# Patient Record
Sex: Female | Born: 1980 | Race: White | Hispanic: No | Marital: Single | Smoking: Former smoker
Health system: Southern US, Community
[De-identification: ages and names within clinical notes are randomized; demographics above are authoritative.]

## PROBLEM LIST (undated history)

## (undated) DIAGNOSIS — N611 Abscess of the breast and nipple: Secondary | ICD-10-CM

## (undated) DIAGNOSIS — E079 Disorder of thyroid, unspecified: Secondary | ICD-10-CM

## (undated) HISTORY — PX: TUBAL LIGATION: SHX77

## (undated) HISTORY — DX: Abscess of the breast and nipple: N61.1

## (undated) HISTORY — DX: Disorder of thyroid, unspecified: E07.9

---

## 2002-03-26 ENCOUNTER — Observation Stay (HOSPITAL_COMMUNITY): Admission: EM | Admit: 2002-03-26 | Discharge: 2002-03-27 | Payer: Self-pay | Admitting: *Deleted

## 2003-01-16 ENCOUNTER — Encounter (INDEPENDENT_AMBULATORY_CARE_PROVIDER_SITE_OTHER): Payer: Self-pay | Admitting: Specialist

## 2003-01-16 ENCOUNTER — Inpatient Hospital Stay (HOSPITAL_COMMUNITY): Admission: EM | Admit: 2003-01-16 | Discharge: 2003-01-17 | Payer: Self-pay | Admitting: Emergency Medicine

## 2003-01-16 ENCOUNTER — Encounter: Payer: Self-pay | Admitting: Emergency Medicine

## 2003-04-18 HISTORY — PX: APPENDECTOMY: SHX54

## 2004-05-19 ENCOUNTER — Other Ambulatory Visit: Admission: RE | Admit: 2004-05-19 | Discharge: 2004-05-19 | Payer: Self-pay | Admitting: Obstetrics and Gynecology

## 2004-12-12 ENCOUNTER — Inpatient Hospital Stay (HOSPITAL_COMMUNITY): Admission: RE | Admit: 2004-12-12 | Discharge: 2004-12-15 | Payer: Self-pay | Admitting: Obstetrics and Gynecology

## 2005-02-04 ENCOUNTER — Other Ambulatory Visit: Admission: RE | Admit: 2005-02-04 | Discharge: 2005-02-04 | Payer: Self-pay | Admitting: Obstetrics and Gynecology

## 2008-04-19 ENCOUNTER — Encounter: Admission: RE | Admit: 2008-04-19 | Discharge: 2008-04-19 | Payer: Self-pay | Admitting: Obstetrics and Gynecology

## 2008-06-05 ENCOUNTER — Encounter: Admission: RE | Admit: 2008-06-05 | Discharge: 2008-06-05 | Payer: Self-pay | Admitting: Obstetrics and Gynecology

## 2008-07-16 ENCOUNTER — Encounter: Admission: RE | Admit: 2008-07-16 | Discharge: 2008-07-16 | Payer: Self-pay | Admitting: Obstetrics and Gynecology

## 2008-11-22 ENCOUNTER — Emergency Department (HOSPITAL_COMMUNITY): Admission: EM | Admit: 2008-11-22 | Discharge: 2008-11-22 | Payer: Self-pay | Admitting: Family Medicine

## 2008-11-23 ENCOUNTER — Emergency Department (HOSPITAL_COMMUNITY): Admission: EM | Admit: 2008-11-23 | Discharge: 2008-11-23 | Payer: Self-pay | Admitting: Family Medicine

## 2008-12-12 ENCOUNTER — Encounter (INDEPENDENT_AMBULATORY_CARE_PROVIDER_SITE_OTHER): Payer: Self-pay | Admitting: *Deleted

## 2010-03-21 ENCOUNTER — Encounter (INDEPENDENT_AMBULATORY_CARE_PROVIDER_SITE_OTHER): Payer: Self-pay | Admitting: Obstetrics and Gynecology

## 2010-03-21 ENCOUNTER — Inpatient Hospital Stay (HOSPITAL_COMMUNITY): Admission: RE | Admit: 2010-03-21 | Discharge: 2010-03-24 | Payer: Self-pay | Admitting: Obstetrics and Gynecology

## 2010-10-02 LAB — WOUND CULTURE

## 2010-10-02 LAB — CREATININE, SERUM: GFR calc non Af Amer: >60 mL/min (ref >60)

## 2010-10-02 LAB — TYPE AND SCREEN: ABO/RH(D): O POS

## 2010-10-02 LAB — CBC
HCT: 28.9 % — ABNORMAL LOW (ref 36.0–46.0)
Hemoglobin: 10.1 g/dL — ABNORMAL LOW (ref 12.0–15.0)
MCH: 35.5 pg — ABNORMAL HIGH (ref 26.0–34.0)
MCHC: 35.1 g/dL (ref 30.0–36.0)
RBC: 2.86 MIL/uL — ABNORMAL LOW (ref 3.87–5.11)

## 2010-10-03 LAB — CBC
MCV: 100 fL (ref 78.0–100.0)
Platelets: 120 10*3/uL — ABNORMAL LOW (ref 150–400)
RBC: 3.47 MIL/uL — ABNORMAL LOW (ref 3.87–5.11)
RDW: 12.5 % (ref 11.5–15.5)
WBC: 11.6 10*3/uL — ABNORMAL HIGH (ref 4.0–10.5)

## 2010-10-03 LAB — SURGICAL PCR SCREEN

## 2010-10-28 LAB — POCT RAPID STREP A (OFFICE): Streptococcus, Group A Screen (Direct): POSITIVE — AB

## 2010-12-05 NOTE — H&P (Signed)
Chelsea Medina, Chelsea Medina                          ACCOUNT NO.:  [id number]   MEDICAL RECORD NO.:  [id number]                   PATIENT TYPE:  INP   LOCATION:  5039                                 FACILITY:  [facility]   PHYSICIAN:  Abigail Miyamoto, M.D.              DATE OF BIRTH:  10/06/1980   DATE OF ADMISSION:  01/16/2003  DATE OF DISCHARGE:                                HISTORY & PHYSICAL   CHIEF COMPLAINT:  Abdominal pain.   HISTORY:  This is a 30 year old female who presents with greater than 24  hours of right lower quadrant abdominal pain.  The patient reports that the  pain started in the right lower quadrant.  She has had no nausea or vomiting  with this and has been moving her bowels normally.  The pain became  exquisitely worse today, so she presented to the emergency room for  evaluation.  She denies any fevers or chills.  She has no previous abdominal  complaints or significant medical history.  She has had no fevers or chills,  no chest pain, or shortness of breath.   PAST MEDICAL HISTORY:  Negative.   PAST SURGICAL HISTORY:  Includes a caesarean section.   MEDICATIONS:  None.   ALLERGIES:  NO KNOWN DRUG ALLERGIES.   SOCIAL HISTORY:  She does smoke a pack of cigarettes a day and drinks  alcohol on occasion.   REVIEW OF SYSTEMS:  Otherwise unremarkable.   PHYSICAL EXAMINATION:  GENERAL:  Reveals a thin, well-developed, well-  nourished female in no acute distress.  VITAL SIGNS:  She is afebrile.  Her vital signs are stable.  EYES:  She is anicteric.  Pupils are reactive.  NECK:  Supple.  There are no masses or thyromegaly.  LUNGS:  Clear to auscultation bilaterally with normal effort.  EARS, MOUTH AND THROAT:  Ears and nose are normal in appearance.  Hearing is  normal.  The oropharynx is clear.  CARDIOVASCULAR:  Regular rate and rhythm.  There is no peripheral edema.  There are no murmurs.  ABDOMEN:  Soft.  There is tenderness with guarding in the right lower  quadrant.  There are no masses, there are no hernias, there is no  organomegaly.  EXTREMITIES:  Warm and well perfused.   LABORATORY DATA:  The patient has a white blood count of 16.1, hemoglobin of  13.3, hematocrit of 38.8 and platelets of 157.  She has 77% neutrophils.  BUN and creatinine are 8.8.  Potassium is 3.4.  Urinalysis shows the patient  to have 3-6 white blood cells, 0-2 red blood cells and rare bacteria.  Urine  pregnancy test is negative.   X-RAY DATA:  The patient had a CAT scan of the abdomen and pelvis and was  read to have a dilated appendix.  Appendiceal information consistent with  acute appendicitis.   ASSESSMENT/PLAN:  This is a 30 year old female with acute appendicitis.  The  plan is to take her to the operating room for appendectomy and discuss the  risks with the patient in detail, including the risks of bleeding and  infection as well as wound infection.  At this point, she wishes to proceed.  Surgery will thus be scheduled for as soon as possible.                                               Abigail Miyamoto, M.D.    DB/MEDQ  D:  01/16/2003  T:  01/16/2003  Job:  [id number]
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
## 2010-12-05 NOTE — Discharge Summary (Signed)
Chelsea Medina, FERG              ACCOUNT NO.:  [id number]   MEDICAL RECORD NO.:  [id number]          PATIENT TYPE:  INP   LOCATION:  9134                          FACILITY[facility]  [facility]   PHYSICIAN:  Guy Sandifer. Henderson Cloud, M.D. DATE OF BIRTH:  1981-02-06   DATE OF ADMISSION:  12/12/2004  DATE OF DISCHARGE:  12/15/2004                                 DISCHARGE SUMMARY   ADMITTING DIAGNOSIS:  1.  Intrauterine pregnancy at 40-2/7 weeks estimated gestational age.  2.  Previous cesarean section, desires repeat.   DISCHARGE DIAGNOSIS:  1.  Status post low transverse cesarean section.  2.  Viable female infant.   PROCEDURE:  Repeat low transverse cesarean section.   REASON FOR ADMISSION:  Please see dictated H&P.   HOSPITAL COURSE:  The patient is 30 year old white married female gravida 2,  para 1 that was admitted to [facility] for scheduled  cesarean section. The patient had had a previous cesarean and desired  repeat. On the morning of admission the patient was taken to the operating  room where spinal anesthesia was administered without difficulty. Low  transverse incision was made with delivery of a viable female infant weighing  6 pounds 10 ounces with Apgars of 9 at 1 minute and 9 at 5 minutes. The  patient tolerated procedure well and taken to the recovery room in stable  condition. On postoperative day #1 the patient was without complaint. Vital  signs were stable. She was afebrile. Fundus was firm and nontender.  Abdominal dressing was noted to be clean, dry and intact. On postoperative  day #2 the patient was without complaint. Vital signs remained stable. She  was afebrile. Fundus is firm and nontender. Abdominal dressing had been  removed revealing incision that is clean, dry and intact. She is ambulating  well, tolerating a regular diet without complaints of nausea, vomiting. On  postoperative day #3 the patient was without complaint. Vital signs were  stable. Fundus  was firm and nontender. Incision was clean, dry and intact.  Staples removed. The patient's discharged home.   CONDITION ON DISCHARGE:  Good. Diet regular as tolerated.   ACTIVITY:  No heavy lifting, no driving x2 weeks. No vaginal entry.   FOLLOW-UP:  Patient will follow up in the office in 1 week for incision  check. She is to call for temperature greater than 100 degrees, persistent  nausea and vomiting, heavy vaginal bleeding and/or redness or drainage from  incisional site.   DISCHARGE MEDICATIONS:  1.  Percocet 5/325 numbers 30, one p.o. every 4 to 6 hours p.r.n.  2.  Motrin 600 milligrams every 6 hours.  3.  Prenatal vitamins one p.o. daily.  4.  Colace one p.o. daily p.r.n.       CC/MEDQ  D:  12/29/2004  T:  12/29/2004  Job:  [id number]

## 2010-12-05 NOTE — Op Note (Signed)
NAMECAITLAN, CHAUCA                          ACCOUNT NO.:  [id number]   MEDICAL RECORD NO.:  [id number]                   PATIENT TYPE:  INP   LOCATION:  1823                                 FACILITY:  [facility]   PHYSICIAN:  Abigail Miyamoto, M.D.              DATE OF BIRTH:  09/08/1980   DATE OF PROCEDURE:  01/16/2003  DATE OF DISCHARGE:                                 OPERATIVE REPORT   PREOPERATIVE DIAGNOSIS:  Acute appendicitis.   POSTOPERATIVE DIAGNOSIS:  Acute appendicitis.   PROCEDURE:  Appendectomy.   SURGEON:  Abigail Miyamoto, M.D.   ANESTHESIA:  General endotracheal anesthesia.   ESTIMATED BLOOD LOSS:  Minimal.   INDICATIONS:  Harmonee Tozer is a 30 year old female who presented with  right lower quadrant abdominal pain, tenderness on examination, and an  elevated white blood count.  She had a CT scan of the abdomen and pelvis  performed which showed findings consistent with acute appendicitis.   FINDINGS:  The patient was indeed found to have acute appendicitis without  evidence of perforation.   PROCEDURE IN DETAIL:  The patient was brought to the operating room and  identified as Birdie Riddle.  She was placed supine on the operating room  table, and general anesthesia was induced.  Her abdomen was then prepped and  draped in the usual sterile fashion.  Using a #10 blade, a small transverse  incision was made in the patient's right lower quadrant.  The incision was  carried down through Scarpa's fascia with electrocautery.  The external  oblique fascia was then identified and opened with the cautery.  The  underlying muscle fibers were then bluntly retracted and the peritoneum was  identified and opened with a scalpel.  Upon entering the peritoneal cavity,  a mild amount of turbid fluid was identified.  The cecum was then easily  identified.  The appendix was found to be retrocecal.  Several adhesions  were then taken down with the electrocautery, and this  allowed the appendix  to be elevated up into the incision.  The mesoappendix was taken down with  clamps and 2-0 Vicryl ties.  The base of the appendix was identified and  clamped, clasped with a hemostat.  The appendix was then transected and sent  to pathology for identification.  The appendiceal stump was then closed with  a 2-0 Vicryl tie.  The exposed mucosa was cauterized with the  electrocautery.  Good closure of the stump appeared to be achieved.  At this  point the abdomen was irrigated with normal saline.  The peritoneum was then  closed with a running 2-0 Vicryl suture.  The fascia was then closed with a  running #1 Prolene suture.  The skin as then irrigated further.  The  subcutaneous layer was closed with interrupted 3-0 Vicryl suture.  The skin  was closed with skin staples.  The patient tolerated the procedure well.  All  sponge, needle, and instrument counts were correct at the end of the  procedure.  The patient was then extubated in the operating room and taken  in a stable condition to the recovery room.                                              Abigail Miyamoto, M.D.   DB/MEDQ  D:  01/16/2003  T:  01/16/2003  Job:  [id number]

## 2010-12-05 NOTE — Op Note (Signed)
Chelsea Medina, Chelsea Medina              ACCOUNT NO.:  [id number]   MEDICAL RECORD NO.:  [id number]          PATIENT TYPE:  INP   LOCATION:  9134                          FACILITY:  [facility]   PHYSICIAN:  Guy Sandifer. Henderson Cloud, M.D. DATE OF BIRTH:  Apr 17, 1981   DATE OF PROCEDURE:  12/12/2004  DATE OF DISCHARGE:                                 OPERATIVE REPORT   PREOPERATIVE DIAGNOSES:  1.  Intrauterine pregnancy at 40-2/7 weeks estimated gestational age.  2.  Previous cesarean section, desires repeat.   POSTOPERATIVE DIAGNOSES:  1.  Intrauterine pregnancy at 40-2/7 weeks estimated gestational age.  2.  Previous cesarean section, desires repeat.   PROCEDURE:  Low transverse cesarean section.   SURGEON:  Harold Hedge, M.D.   ANESTHESIA:  Spinal, Cline Crock, M.D.   ESTIMATED BLOOD LOSS:  800 mL.   FINDINGS:  Viable female infant, Apgars of 9 and 9 at one and five minutes,  respectively.  Birth weight 6 pounds 10 ounces, arterial cord pH is pending.   INDICATIONS AND CONSENT:  This patient is a 30 year old married white  female, G2, P74, with an EDC of Dec 09, 2004, placing her at 40-2/7 weeks.  She has a previous cesarean section and after discussing the options,  desires repeat.  Potential risks and complications have been discussed  preoperatively, including but not limited to, infection; bowel, bladder,  ureteral damage; bleeding requiring transfusion of blood products with  possible transfusion reaction; HIV and hepatitis acquisition; DVT, PE, and  pneumonia.  All questions have been answered, and consent is signed on the  chart.   DESCRIPTION OF PROCEDURE:  The patient is taken to the operating room where  she is identified.  Spinal anesthetic is placed, and she is placed in the  dorsal supine position with a 15-degree left lateral wedge.  She is then  prepped.  Foley catheter is placed, and the bladder is drained, and she is  draped in a sterile fashion.  After testing for adequate  spinal anesthesia,  Pfannenstiel incision is made, removing the old scar on the way in.  Dissection is carried out in layers to the peritoneum which is incised and  extended superiorly and inferiorly.  Some adhesions of the vesicouterine  peritoneum to the anterior uterine fundus as well as some adhesions of the  omentum to the anterior abdominal wall.  These are taken down to provide  adequate exposure.  The vesicouterine peritoneum is taken down  cephalolaterally.  The bladder flap is developed, and the bladder blade is  placed.  The uterus is then excised in a low transverse manner.  The uterine  cavity is entered bluntly with a hemostat.  The uterine incision is extended  cephalolaterally with the fingers.  Artificial rupture of membranes with  clear fluid is carried out.  The vertex is delivered.  Nuchal cord x 1 is  reduced.  The remainder of the baby is delivered.  Good cry and tone is  noted, and the cord is clamped and cut, and the baby is handed to the  awaiting pediatrics team.  Placenta is manually delivered.  Uterine cavity  is cleaned.  Uterus is closed in two running locking imbricating layers with  0 Monocryl suture which achieves good hemostasis.  Tubes and ovaries palpate  normally.  The anterior peritoneum is closed in a running  fashion with 0 Monocryl suture which is also used to reapproximate the  pyramidalis muscle in the midline.  Anterior rectus fascia is closed in  running fashion with 0 PDS suture, and the skin is closed with clips.  All  sponge, instrument, and needle counts are correct, and the patient is  transferred to the recovery room in stable condition.      JET/MEDQ  D:  12/12/2004  T:  12/12/2004  Job:  [id number]

## 2010-12-12 ENCOUNTER — Other Ambulatory Visit: Payer: Self-pay | Admitting: Family Medicine

## 2010-12-12 ENCOUNTER — Encounter: Payer: Self-pay | Admitting: Family Medicine

## 2010-12-12 ENCOUNTER — Ambulatory Visit (INDEPENDENT_AMBULATORY_CARE_PROVIDER_SITE_OTHER): Payer: PRIVATE HEALTH INSURANCE | Admitting: Family Medicine

## 2010-12-12 VITALS — BP 110/71 | HR 87 | Temp 98.4°F | Ht 67.25 in | Wt 131.0 lb

## 2010-12-12 DIAGNOSIS — F17200 Nicotine dependence, unspecified, uncomplicated: Secondary | ICD-10-CM

## 2010-12-12 DIAGNOSIS — R5383 Other fatigue: Secondary | ICD-10-CM | POA: Insufficient documentation

## 2010-12-12 DIAGNOSIS — J029 Acute pharyngitis, unspecified: Secondary | ICD-10-CM

## 2010-12-12 DIAGNOSIS — R5381 Other malaise: Secondary | ICD-10-CM

## 2010-12-12 DIAGNOSIS — E039 Hypothyroidism, unspecified: Secondary | ICD-10-CM

## 2010-12-12 DIAGNOSIS — Z720 Tobacco use: Secondary | ICD-10-CM

## 2010-12-12 LAB — COMPREHENSIVE METABOLIC PANEL
ALT: 15 U/L (ref 0–35)
AST: 23 U/L (ref 0–37)
Alkaline Phosphatase: 54 U/L (ref 39–117)
CO2: 27 mEq/L (ref 19–32)
Creat: 0.82 mg/dL (ref 0.40–1.20)
Sodium: 141 mEq/L (ref 135–145)
Total Bilirubin: 0.6 mg/dL (ref 0.3–1.2)
Total Protein: 6.8 g/dL (ref 6.0–8.3)

## 2010-12-12 LAB — POCT RAPID STREP A (OFFICE): Rapid Strep A Screen: NEGATIVE

## 2010-12-12 MED ORDER — VARENICLINE TARTRATE 1 MG PO TABS: 1.0000 mg | ORAL_TABLET | Freq: Two times a day (BID) | ORAL | Status: AC

## 2010-12-12 NOTE — Assessment & Plan Note (Signed)
Pt with findings of mild thyroid tenderness, sister with hx of tyroid cysts will get TSH and make sure normal.  Will get CMET as well.

## 2010-12-12 NOTE — Progress Notes (Signed)
  Subjective:    Patient ID: Chelsea Medina, female    DOB: 03/17/1981, 30 y.o.   MRN: [id number]  HPI  Pt is here to establish care.  Pt has not seen a regular doctor for some time. Many years 1.  Sore throat-  Pt states that it has been occuring for many months now and seemed to be getting worse until the last month and now getting a little better.  Pt does state that it hurts with swallowing sometimes and with smoking, feels like there is a know sometimes it hurts right in the middle of her throat.  Denies a cough, no blood no abnormal weight loss has been a little more tired then usual.  Denies fever, chills, nausea vomiting abdominal pain.  Pt states never chokes, does not remember any type of injury.   2.  Smoking-  Pt would like to quit has three children at home and wants to quit.  Smoke 1/2 ppd. Has tried quitting in the past with wellbutrin but made her really moody.  Pt would like to try something else if possible.  No history of seizures or depression. Pt has only quit for one day since she started never really attempted to before.  Pt has not thought of a quit date but ready to set one.   3.  Preventative-  Pt has pap done at [facility], always normal per pt.    Review of Systems See above.     Objective:   Physical Exam Gen: NAD PERRLA EOMI TM intact bilateral Mouth: Pt does have healed ulcer on left upper soft palate, no blood well healed, a couple cavities right lower side, mild pharynx erythema but no PND, no masses no other lesions Neck:  Mild tenderness to her thyroid bilateral, no masses felt does have mild shotty anterior lymph nodes on the left side, mildly tender.  Abd: BS+, NT, ND Ext: mild varicose veins noted bilateral LE, no edema, NVI     Assessment & Plan:

## 2010-12-12 NOTE — Patient Instructions (Signed)
Always great to see you I will call you with lab results I am giving you a prescription for chantix  Start 1 week before your quit date of June 15th.  I want to see you again in 2 months to see how you are doing with the smoking.

## 2010-12-17 ENCOUNTER — Telehealth: Payer: Self-pay | Admitting: Family Medicine

## 2010-12-17 DIAGNOSIS — E039 Hypothyroidism, unspecified: Secondary | ICD-10-CM | POA: Insufficient documentation

## 2010-12-17 MED ORDER — LEVOTHYROXINE SODIUM 75 MCG PO TABS: 75.0000 ug | ORAL_TABLET | Freq: Every day | ORAL | Status: DC

## 2010-12-17 NOTE — Progress Notes (Addendum)
Addended by: Judi Saa on: 12/17/2010 02:12 PM  Modules accepted: Orders Pt shows significant hypothyroidism, will start medication with thyroid.

## 2010-12-17 NOTE — Telephone Encounter (Signed)
Left message with pt that thyroid is not working properly and we should start a medication that would help with her fatigue.

## 2010-12-17 NOTE — Progress Notes (Signed)
Addended by: Judi Saa on: 12/17/2010 02:15 PM   Modules accepted: Orders

## 2010-12-18 ENCOUNTER — Telehealth: Payer: Self-pay | Admitting: Family Medicine

## 2010-12-18 NOTE — Telephone Encounter (Signed)
Attempted again and no answer left message stating no vitamins would help needs medication.   If have questions then call back

## 2010-12-18 NOTE — Telephone Encounter (Signed)
Will forward to MD.  

## 2010-12-18 NOTE — Telephone Encounter (Signed)
Have attempted to call back multiple times without an answer will try again later.

## 2010-12-18 NOTE — Telephone Encounter (Signed)
Was given message about meds at pharmacy. Wants to talk to Dr Katrinka Blazing or nurse about what causes thyroid problems - also what kind of vitamins or something to improve her condition on top of medication.

## 2011-01-19 ENCOUNTER — Encounter: Payer: Self-pay | Admitting: Family Medicine

## 2011-01-19 ENCOUNTER — Ambulatory Visit (INDEPENDENT_AMBULATORY_CARE_PROVIDER_SITE_OTHER): Payer: PRIVATE HEALTH INSURANCE | Admitting: Family Medicine

## 2011-01-19 VITALS — BP 122/77 | HR 98 | Temp 98.7°F | Wt 129.0 lb

## 2011-01-19 DIAGNOSIS — Z720 Tobacco use: Secondary | ICD-10-CM

## 2011-01-19 DIAGNOSIS — E039 Hypothyroidism, unspecified: Secondary | ICD-10-CM

## 2011-01-19 DIAGNOSIS — F17200 Nicotine dependence, unspecified, uncomplicated: Secondary | ICD-10-CM

## 2011-01-19 DIAGNOSIS — J309 Allergic rhinitis, unspecified: Secondary | ICD-10-CM | POA: Insufficient documentation

## 2011-01-19 MED ORDER — LEVOTHYROXINE SODIUM 88 MCG PO TABS: 88.0000 ug | ORAL_TABLET | Freq: Every day | ORAL | Status: DC

## 2011-01-19 MED ORDER — FLUTICASONE PROPIONATE 50 MCG/ACT NA SUSP: 1.0000 | Freq: Every day | NASAL | Status: DC

## 2011-01-19 NOTE — Patient Instructions (Signed)
It is good to see you I do think some of your pain is allergies.  We will start a nose spray daily Increased your thyroid medicine to daily I want to see you again when I see Pamelia Hoit again

## 2011-01-19 NOTE — Assessment & Plan Note (Signed)
Will start flonase will also help with nasal polyp.

## 2011-01-19 NOTE — Assessment & Plan Note (Signed)
Still smoking.  Will discuss at next visit again.

## 2011-01-19 NOTE — Assessment & Plan Note (Signed)
Will increase to , will have pt come back in 1 month and check, will need to watch weight loss in pt who is already thin.

## 2011-01-19 NOTE — Progress Notes (Signed)
  Subjective:    Patient ID: Chelsea Medina, female    DOB: September 09, 1980, 30 y.o.   MRN: [id number]  HPI F/u for hypothyroidism-  Pt was started on synthroid has been taking it but still feels very warn out.  Pt states she does has not been feeling like herself, she may be feeling like she has a little more energy but not completely. Pt states she is still having a little of the sore throat as well.  Pt though has no trouble with swallowing, no heart palpitations, has had a 2 pound weight loss, no hair loss.   Still smoking, has chantix but has not started.   Review of Systems Denies fever, chills, nausea vomiting abdominal pain, dysuria, chest pain, shortness of breath dyspnea on exertion or numbness in extremities Past medical history, social, surgical and family history all reviewed.      Objective:   Physical Exam    Gen: NAD HEENT: + PND, pt has small nasal polyp left side mild posterior pharynx erythema mild swelling of submandibular glands.  Pul: CTAB CV: RRR no murmur Abd: NT, ND BS+  Assessment & Plan:

## 2011-02-24 ENCOUNTER — Ambulatory Visit: Payer: PRIVATE HEALTH INSURANCE | Admitting: Family Medicine

## 2011-04-03 ENCOUNTER — Other Ambulatory Visit: Payer: PRIVATE HEALTH INSURANCE

## 2011-04-03 DIAGNOSIS — E039 Hypothyroidism, unspecified: Secondary | ICD-10-CM

## 2011-04-03 NOTE — Progress Notes (Signed)
tsh done today marci holder 

## 2011-04-04 LAB — TSH: TSH: 2.274 u[IU]/mL (ref 0.350–4.500)

## 2011-04-07 ENCOUNTER — Telehealth: Payer: Self-pay | Admitting: Family Medicine

## 2011-04-07 NOTE — Telephone Encounter (Signed)
Pt requesting lab results.  Can leave msg on voicemail.

## 2011-04-08 NOTE — Telephone Encounter (Signed)
Pt is calling again to see about her lab results- she is wanting to know because she is feeling bad and thinks it's caused by her thyroid.  If it's not that, then she needs to know what else to do.  Wants to hear from someone today.

## 2011-04-08 NOTE — Telephone Encounter (Signed)
I did call and leave a message.  She needs to be seen then, not her thyroid.

## 2011-05-21 ENCOUNTER — Other Ambulatory Visit: Payer: Self-pay | Admitting: Family Medicine

## 2011-05-21 DIAGNOSIS — E039 Hypothyroidism, unspecified: Secondary | ICD-10-CM

## 2011-05-21 MED ORDER — LEVOTHYROXINE SODIUM 88 MCG PO TABS: 88.0000 ug | ORAL_TABLET | Freq: Every day | ORAL | Status: DC

## 2011-06-10 ENCOUNTER — Telehealth: Payer: Self-pay | Admitting: *Deleted

## 2011-06-10 NOTE — Telephone Encounter (Signed)
Patient comes to office at 3:45 stating she has had a problem with right breast cyst for 3 years. Has had ultrasound and  has been told not cancer. 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  No problem in over a year but now a few days ago flared up again and  and area was very tender and swollen. She called office this AM  and was offered appointment here at 1:30 but she could not come because she is cooking for Thanksgiving. States she took a shower and area drained and she caught drainage in a sterile urine cup we had given her in the past. Wants to know if it can be sent for testing. Consulted with Dr. Jennette Kettle and she advises that  patient needs to be seen and recommended she go to Urgent Care now as we have no doctor available to see her today. Patient states she does not have time and will make appointment for Monday. Appointment scheduled. Dr. Jennette Kettle notified . Advised to go to Urgent Care if worsens or develops fever.

## 2011-06-11 ENCOUNTER — Encounter (HOSPITAL_COMMUNITY): Payer: Self-pay | Admitting: Emergency Medicine

## 2011-06-11 ENCOUNTER — Emergency Department (HOSPITAL_COMMUNITY)
Admission: EM | Admit: 2011-06-11 | Discharge: 2011-06-12 | Disposition: A | Discharge status: Home / Self Care | Payer: PRIVATE HEALTH INSURANCE | Attending: Emergency Medicine | Admitting: Emergency Medicine

## 2011-06-11 DIAGNOSIS — N63 Unspecified lump in unspecified breast: Secondary | ICD-10-CM | POA: Insufficient documentation

## 2011-06-11 DIAGNOSIS — F17200 Nicotine dependence, unspecified, uncomplicated: Secondary | ICD-10-CM | POA: Insufficient documentation

## 2011-06-11 DIAGNOSIS — L209 Atopic dermatitis, unspecified: Secondary | ICD-10-CM

## 2011-06-11 DIAGNOSIS — L2089 Other atopic dermatitis: Secondary | ICD-10-CM | POA: Insufficient documentation

## 2011-06-11 DIAGNOSIS — N6459 Other signs and symptoms in breast: Secondary | ICD-10-CM | POA: Insufficient documentation

## 2011-06-11 DIAGNOSIS — N611 Abscess of the breast and nipple: Secondary | ICD-10-CM

## 2011-06-11 DIAGNOSIS — N61 Mastitis without abscess: Secondary | ICD-10-CM | POA: Insufficient documentation

## 2011-06-11 MED ORDER — DOXYCYCLINE HYCLATE 100 MG PO TABS
100.0000 mg | ORAL_TABLET | Freq: Once | ORAL | Status: AC
  Administered 2011-06-11: 100 mg via ORAL
  Filled 2011-06-11: qty 1

## 2011-06-11 NOTE — ED Notes (Signed)
PT. REPORTS RIGHT BREAST ABSCESS / LUMP X 9 DAYS , DENIES INJURY , DRAINAGE (DARK GREEN) YESTERDAY , DENIES FEVER OR CHILLS.

## 2011-06-11 NOTE — ED Notes (Signed)
MD at bedside. 

## 2011-06-11 NOTE — ED Provider Notes (Signed)
History     CSN: [id number] Arrival date & time: 06/11/2011 10:40 PM   First MD Initiated Contact with Patient 06/11/11 2253      Chief Complaint  Patient presents with  . Breast Mass    (Consider location/radiation/quality/duration/timing/severity/associated sxs/prior treatment) HPI Comments: Recurrent R breast cyst now with redness swelling and drainage started 5 days ago   The history is provided by the patient.    History reviewed. No pertinent past medical history.  Past Surgical History  Procedure Date  . Cesarean section     3 times  . Tubal ligation     2011    Family History  Problem Relation Age of Onset  . Diabetes Mother     History  Substance Use Topics  . Smoking status: Current Everyday Smoker -- 0.5 packs/day for 15 years    Types: Cigarettes  . Smokeless tobacco: Not on file  . Alcohol Use: 3.5 oz/week    7 drink(s) per week    OB History    Grav Para Term Preterm Abortions TAB SAB Ect Mult Living                  Review of Systems  Constitutional: Negative.   HENT: Negative.   Eyes: Negative.   Respiratory: Negative.   Cardiovascular: Negative.   Gastrointestinal: Negative.   Genitourinary: Negative.   Musculoskeletal: Negative.   Skin: Positive for color change and wound.  Neurological: Negative.   Hematological: Negative.   Psychiatric/Behavioral: Negative.     Allergies  Review of patient's allergies indicates no known allergies.  Home Medications   Current Outpatient Rx  Name Route Sig Dispense Refill  . FLUTICASONE PROPIONATE 50 MCG/ACT NA SUSP Nasal Place 1 spray into the nose daily. 16 g 2  . LEVOTHYROXINE SODIUM 88 MCG PO TABS Oral Take 1 tablet (88 mcg total) by mouth daily. 90 tablet 3  . OVER THE COUNTER MEDICATION Topical Apply 1 application topically 2 (two) times daily. OTC MEDICATION: ORGANIC MANUKA HONEY. APPLY TO RIGHT BREAST.     Marland Kitchen DOXYCYCLINE HYCLATE 100 MG PO CAPS Oral Take 1 capsule (100 mg total) by  mouth 2 (two) times daily. 20 capsule 0  . HYDROCORTISONE 1 % EX CREA  Apply to affected area 2 times daily 15 g 0    BP 137/90  Pulse 94  Temp(Src) 98 F (36.7 C) (Oral)  Resp 18  SpO2 99%  LMP 06/07/2011  Physical Exam  Constitutional: She is oriented to person, place, and time. She appears well-developed and well-nourished.  HENT:  Head: Normocephalic.  Eyes: EOM are normal.  Neck: Neck supple.  Cardiovascular: Normal rate.   Pulmonary/Chest: Effort normal.  Musculoskeletal: Normal range of motion.  Neurological: She is oriented to person, place, and time.  Skin: Lesion and rash noted. Rash is pustular.          Entire R breast red swollen with orange peel appearance small 3 mm opening at 1 o clock draining purulent material     ED Course  Procedures (including critical care time)   Labs Reviewed  WOUND CULTURE   No results found.   1. Abscess of breast, right   2. Atopic dermatitis, mild     1:05 AM Dr. Willaim Bane to see patient would like to continue Doxycycline PO X 10 days would also like to give hydrocortisone cream to elbow and knees and FU per scheduled appointment on Monday with Family Practice   MDM  Abscess  Arman Filter, NP 06/11/11 5621  Arman Filter, NP 06/12/11 0100  Arman Filter, NP 06/12/11 [phone]

## 2011-06-12 MED ORDER — HYDROCORTISONE 1 % EX CREA: TOPICAL_CREAM | CUTANEOUS | Status: AC

## 2011-06-12 MED ORDER — DOXYCYCLINE HYCLATE 100 MG PO CAPS: 100.0000 mg | ORAL_CAPSULE | Freq: Two times a day (BID) | ORAL | Status: AC

## 2011-06-12 NOTE — Consult Note (Signed)
Chelsea Medina is an 30 y.o. female.   Chief Complaint: draining right breast abscess HPI: This is a 30 YO Caucasian female with a history of tobacco dependence and hypothyroidism on Synthroid presenting with draining right breast abscess and cellulitis. Noticed "cyst" on right breast near nipple about 5 days ago. This morning (11/22), started draining purulent fluid. Pulled out two "clumps" of whitish material. Later that day, started noticing redness around her breast.    Patient Active Problem List  Diagnoses Date Noted  . Allergic rhinitis 01/19/2011  . Hypothyroidism 12/17/2010  . Tobacco abuse 12/12/2010  . Fatigue 12/12/2010   History reviewed. No pertinent past medical history.  Past Surgical History  Procedure Date  . Cesarean section     3 times  . Tubal ligation     2011    Family History  Problem Relation Age of Onset  . Diabetes Mother    Social History:  reports that she has been smoking Cigarettes.  She has a 7.5 pack-year smoking history. She does not have any smokeless tobacco history on file. She reports that she drinks about 3.5 ounces of alcohol per week. She reports that she does not use illicit drugs. Has 2 children. Works as Production assistant, radio at [employer].   Allergies: No Known Allergies  Medications Prior to Admission  Medication Dose Route Frequency Provider Last Rate Last Dose  . doxycycline (VIBRA-TABS) tablet 100 mg  100 mg Oral Once Arman Filter, NP   100 mg at 06/11/11 2344   Medications Prior to Admission  Medication Sig Dispense Refill  . fluticasone (FLONASE) 50 MCG/ACT nasal spray Place 1 spray into the nose daily.  16 g  2  . levothyroxine (SYNTHROID, LEVOTHROID) 88 MCG tablet Take 1 tablet (88 mcg total) by mouth daily.  90 tablet  3    No results found for this or any previous visit (from the past 48 hour(s)). No results found.  ROS Denies fevers, chills, nausea, vomiting.  Has had cold symptoms for the past several days with sore  throat and nasal congestion.  Not lactating. Denies using new soaps/creams/lotions/laundry detergent. Denies nipple discharge/drainage. Complaining of rash on elbows and shins.  Blood pressure 137/90, pulse 94, temperature 98 F (36.7 C), temperature source Oral, resp. rate 18, last menstrual period 06/07/2011, SpO2 99.00%. Physical Exam  Gen: NAD Psych: appropriate and engaged; alert and oriented fully CV: RRR Pulm: CTAB Skin: warm, dry, non-diaphoretic   Right breast with 1 cm lesion around 2 o'clock position without active drainage and with surrounding cellulitis across most of right breast but not extending to chest wall; non-warm; mild-moderate TTP. No nipple discharge.   Bilateral elbows and bilateral shins with itchy, erythematous, patchy macular rash with scattered punctate scars  Assessment/Plan This is a 30 YO Caucasian female with a history of tobacco dependence and hypothyroidism on Synthroid presenting with draining right breast abscess and cellulitis.  Right breast abscess and cellulitis Now spontaneously draining. Risk factors are smoking. Without systemic symptoms.  -Advised smoking cessation. Not ready to quit.  -Will start doxycycline and continue for 10 days. Advised her to keep area clean and covered. -Has appointment to follow-up with PCP on 11/26. -Given red flags to return to ED: worsening rash, fever, nipple discharge/drainage.  Rash on elbows and shins She has history of eczema. Has been trying coconut lotion on it but has not helped. This has been going on for about a month. -Will give Rx for hydrocortisone cream. -Concern for  scabies based on itchiness and scattered punctate scabs but patient without risk factors and usual clinical presentation (no known sick contacts, patient is well-groomed, is not around children). Will recommend follow-up of this by her PCP.   OH PARK, ANGELA 06/12/2011, 12:40 AM

## 2011-06-12 NOTE — ED Provider Notes (Signed)
Medical screening examination/treatment/procedure(s) were conducted as a shared visit with non-physician practitioner(s) and myself.  I personally evaluated the patient during the encounter Pt complains of swelling right breast.  pe infected right breast  Benny Lennert, MD 06/12/11 [phone]

## 2011-06-14 LAB — WOUND CULTURE
Culture: NO GROWTH
Gram Stain: NONE SEEN

## 2011-06-15 ENCOUNTER — Encounter: Payer: Self-pay | Admitting: Family Medicine

## 2011-06-15 ENCOUNTER — Ambulatory Visit (INDEPENDENT_AMBULATORY_CARE_PROVIDER_SITE_OTHER): Payer: PRIVATE HEALTH INSURANCE | Admitting: Family Medicine

## 2011-06-15 VITALS — BP 122/74 | HR 80 | Temp 98.3°F | Ht 67.0 in | Wt 131.1 lb

## 2011-06-15 DIAGNOSIS — N61 Mastitis without abscess: Secondary | ICD-10-CM

## 2011-06-15 DIAGNOSIS — N611 Abscess of the breast and nipple: Secondary | ICD-10-CM | POA: Insufficient documentation

## 2011-06-15 NOTE — Patient Instructions (Signed)
Chelsea Medina,  Thank you for coming in today.  Please complete your course of antibiotics and continue to use warm compresses on your breast.  Please come back if the redness, pain, or drainage worsens.  -Dr. Armen Pickup

## 2011-06-15 NOTE — Progress Notes (Signed)
  Subjective:    Patient ID: Chelsea Medina, female    DOB: 08/16/1980, 30 y.o.   MRN: [id number]  HPI 30 year old female who presents with complaint of painful  right breast abscess x1-1/2 weeks.  the cyst  began to spontaneously drain purulent discharge on the morning of 11/22. She  was see in the [facility] ED on 11/22 were simple of the drainage is collected for culture. The patient was started on doxycycline for right breast abscess and cellulitis. Since her ED visit the patient reports improvement in her pain and decreased drainage. She has persistent cellulitis which has improved. She does endorse itching around the drain site. She denies fever and chills.   Per patient she has had intermittent abscess development for the past 3 years. Last occurrence of September 2011 after the birth of her last child. She does have this cyst drained at the breast imaging center under ultrasound guidance.   Patient continues to smoke about a pack a day.  Reviewed ED visit note. Patient with negative culture. Review of Systems Pertinent review of systems as per history of present illness    Objective:   Physical Exam Gen: Thin well-developed female in no acute distress. Normal work of breathing. Skin:  Right breast: Periareolar erythema. 1 x 1 cm of induration at 12:00, scant white drainage expressed from drainage site. No streaking. No right axillary lymphadenopathy. Left breast: Normal without rash, masses, or left axillary lymphadenopathy.      Assessment & Plan:

## 2011-06-15 NOTE — Assessment & Plan Note (Addendum)
A: Right breast abscess spontaneously spontaneously draining. Patient completing course of doxycycline. Negative for culture. 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 P:  -Complete course of doxycycline. -Warm compress to promote drainage. -The patient to limit smoking for next week or so to promote wound healing, with goal of cessation.  -Red flags reviewed to return to care including worsening pain, redness, swelling. -Referral to general surgery [facility]) for evaluation for possible mammary duct fistulotomy.

## 2011-06-18 ENCOUNTER — Encounter (INDEPENDENT_AMBULATORY_CARE_PROVIDER_SITE_OTHER): Payer: Self-pay | Admitting: Surgery

## 2011-07-06 ENCOUNTER — Ambulatory Visit (INDEPENDENT_AMBULATORY_CARE_PROVIDER_SITE_OTHER): Payer: Self-pay | Admitting: Surgery

## 2011-08-06 ENCOUNTER — Encounter (INDEPENDENT_AMBULATORY_CARE_PROVIDER_SITE_OTHER): Payer: Self-pay | Admitting: Surgery

## 2011-08-12 ENCOUNTER — Encounter (INDEPENDENT_AMBULATORY_CARE_PROVIDER_SITE_OTHER): Payer: Self-pay | Admitting: Surgery

## 2011-08-24 ENCOUNTER — Telehealth: Payer: Self-pay | Admitting: Family Medicine

## 2011-08-24 DIAGNOSIS — E039 Hypothyroidism, unspecified: Secondary | ICD-10-CM

## 2011-08-24 NOTE — Telephone Encounter (Signed)
Sounds great labs are in.

## 2011-08-24 NOTE — Telephone Encounter (Signed)
Pt is wanting to come in to have her thyroid labs done and needs orders for this.  She made an appt for next Monday for labs unless doctor determines she does not need them

## 2011-08-24 NOTE — Telephone Encounter (Signed)
Addended by: Judi Saa on: 08/24/2011 05:55 PM   Modules accepted: Orders

## 2011-08-25 ENCOUNTER — Encounter: Payer: Self-pay | Admitting: Family Medicine

## 2011-08-25 ENCOUNTER — Ambulatory Visit (INDEPENDENT_AMBULATORY_CARE_PROVIDER_SITE_OTHER): Payer: PRIVATE HEALTH INSURANCE | Admitting: Family Medicine

## 2011-08-25 VITALS — BP 136/78 | HR 99 | Temp 98.2°F | Ht 67.0 in | Wt 136.0 lb

## 2011-08-25 DIAGNOSIS — L309 Dermatitis, unspecified: Secondary | ICD-10-CM | POA: Insufficient documentation

## 2011-08-25 DIAGNOSIS — E039 Hypothyroidism, unspecified: Secondary | ICD-10-CM

## 2011-08-25 DIAGNOSIS — L259 Unspecified contact dermatitis, unspecified cause: Secondary | ICD-10-CM

## 2011-08-25 MED ORDER — TRIAMCINOLONE ACETONIDE 0.5 % EX CREA: TOPICAL_CREAM | Freq: Three times a day (TID) | CUTANEOUS | Status: DC

## 2011-08-25 NOTE — Progress Notes (Signed)
  Subjective:    Patient ID: Chelsea Medina, female    DOB: 12/05/1980, 31 y.o.   MRN: [id number]  HPI    Review of Systems     Objective:   Physical Exam        Assessment & Plan:

## 2011-08-25 NOTE — Progress Notes (Signed)
  Subjective:    Patient ID: Chelsea Medina, female    DOB: 05/29/1981, 31 y.o.   MRN: [id number]  HPI This patient is seen with a 2 to three-month history of dry scaly patchy rash that started on left ankle progressed to left hip and right upper arm. The areas are pruritic. She's been using hydrocortisone cream, which is not helpful. She does have occasional raised area and the sponges. The areas are getting worse. She does have a son with allergies and eczema.   Review of Systems  Constitutional: Negative for fever and fatigue.  Musculoskeletal: Negative for joint swelling, arthralgias and gait problem.  Skin: Negative for color change.       Objective:   Physical Exam  Constitutional: She is oriented to person, place, and time. She appears well-developed and well-nourished.  Neurological: She is alert and oriented to person, place, and time.  Skin:       Eczematic type rash on left hip. Mild skilled skin on left medial ankle and right upper arm.      Assessment & Plan:

## 2011-08-25 NOTE — Patient Instructions (Signed)

## 2011-08-25 NOTE — Assessment & Plan Note (Signed)
Will give triamcinolone to use 3 times a day for the next 3 or 4 days and decrease as tolerated.

## 2011-08-25 NOTE — Progress Notes (Signed)
Addended by: Levie Heritage on: 08/25/2011 10:43 AM   Modules accepted: Orders

## 2011-08-26 ENCOUNTER — Telehealth: Payer: Self-pay | Admitting: Family Medicine

## 2011-08-26 LAB — TSH: TSH: 10.356 u[IU]/mL — ABNORMAL HIGH (ref 0.350–4.500)

## 2011-08-26 NOTE — Telephone Encounter (Signed)
Patient is calling because she had several missed calls from this #.  She is sure that it is pertaining to her test results.  She said it is ok to leave the results on her voicemail.

## 2011-08-27 ENCOUNTER — Telehealth: Payer: Self-pay | Admitting: Family Medicine

## 2011-08-27 DIAGNOSIS — E039 Hypothyroidism, unspecified: Secondary | ICD-10-CM

## 2011-08-27 MED ORDER — LEVOTHYROXINE SODIUM 100 MCG PO TABS: 100.0000 ug | ORAL_TABLET | Freq: Every day | ORAL | Status: DC

## 2011-08-27 NOTE — Telephone Encounter (Signed)
Called left message with patient stating where an increase her thyroid medication and see how she tolerates it.

## 2011-08-27 NOTE — Telephone Encounter (Signed)
Pt called to say her labs were given by provider via msg, but no exact numbers were given as to the amount of levels that were high.  Please call pat back at number left and lv voice msg with numbers.

## 2011-08-27 NOTE — Telephone Encounter (Signed)
Called back and left message on voice well.

## 2011-08-28 NOTE — Telephone Encounter (Signed)
LMOVM informing pt...............................Jessica Fleeger, CMA  

## 2011-08-31 ENCOUNTER — Other Ambulatory Visit: Payer: PRIVATE HEALTH INSURANCE

## 2011-09-15 ENCOUNTER — Ambulatory Visit: Payer: PRIVATE HEALTH INSURANCE | Admitting: Family Medicine

## 2011-09-23 ENCOUNTER — Encounter: Payer: Self-pay | Admitting: Family Medicine

## 2011-09-23 ENCOUNTER — Ambulatory Visit (INDEPENDENT_AMBULATORY_CARE_PROVIDER_SITE_OTHER): Payer: PRIVATE HEALTH INSURANCE | Admitting: Family Medicine

## 2011-09-23 VITALS — BP 126/72 | HR 81 | Temp 98.2°F | Ht 67.0 in | Wt 142.0 lb

## 2011-09-23 DIAGNOSIS — E039 Hypothyroidism, unspecified: Secondary | ICD-10-CM

## 2011-09-23 DIAGNOSIS — Z720 Tobacco use: Secondary | ICD-10-CM

## 2011-09-23 DIAGNOSIS — F17200 Nicotine dependence, unspecified, uncomplicated: Secondary | ICD-10-CM

## 2011-09-23 DIAGNOSIS — F341 Dysthymic disorder: Secondary | ICD-10-CM

## 2011-09-23 DIAGNOSIS — F419 Anxiety disorder, unspecified: Secondary | ICD-10-CM | POA: Insufficient documentation

## 2011-09-23 MED ORDER — VENLAFAXINE HCL ER 37.5 MG PO CP24: ORAL_CAPSULE | ORAL | Status: DC

## 2011-09-23 MED ORDER — VENLAFAXINE HCL ER 37.5 MG PO CP24: 37.5000 mg | ORAL_CAPSULE | Freq: Every day | ORAL | Status: DC

## 2011-09-23 MED ORDER — HYDROXYZINE HCL 25 MG PO TABS: 25.0000 mg | ORAL_TABLET | Freq: Three times a day (TID) | ORAL | Status: AC | PRN

## 2011-09-23 NOTE — Patient Instructions (Signed)
Good to see you. I'm giving you to a new medications. One is called Effexor. Take one pill daily for the first week then to 2 pills daily thereafter I am also going to give you a medicine called hydroxyzine. You can take this medicine up to 3 times a day for anxiety. I want you to come back in 2 weeks to get her blood drawn I when she to come see me in 3-4 weeks to make an appointment for you as well as Pamelia Hoit.

## 2011-09-23 NOTE — Assessment & Plan Note (Signed)
Still smoking some not ready to make a change.

## 2011-09-23 NOTE — Progress Notes (Signed)
  Subjective:    Patient ID: Chelsea Medina, female    DOB: 07-18-1981, 31 y.o.   MRN: [id number]  HPI Thyroid-patient has a history of hypothyroidism Lab Results  Component Value Date   TSH 10.356* 08/25/2011   patient was increased to 100 mcg daily and since that time has felt like she's had more energy but feels like she is still not quite herself. Patient denies any hair loss but does state she has had a significant weight gain. Filed Weights   09/23/11 0915  Weight: 142 lb (64.411 kg)   denies any swelling  Anxiety-patient states that she is having a very hard time coping with all the different responsibilities that she has regularly. Patient states that at work she's been for frustrated and has been sure with her kids as well. In addition as her mother-in-law who lives with her is very sick and has recently had a heart attack and has not good health. Patient states that there is times when she feels overwhelmed and has some crying spells. Patient has found it hard to enjoy things she used to like to enjoy and is having some trouble with sleeping as well. PHQ9 was done and is significant for moderate depression Patient denies suicidal or homicidal ideation.  Review of Systems Denies fever, chills, nausea vomiting abdominal pain, dysuria, chest pain, shortness of breath dyspnea on exertion or numbness in extremities Past medical history, social, surgical and family history all reviewed.      Objective:   Physical Exam Vital reviewed Gen: NAD HEENT:  TM visualized bilaterally and normal no goiter moist mucous membranes Pul: CTAB CV: RRR no murmur Abd: NT, ND BS+     Assessment & Plan:

## 2011-09-23 NOTE — Assessment & Plan Note (Signed)
Patient showed signs of moderate depression. Patient did take a PHQ 9 which did show moderate depression Will start with hydroxyzine and Effexor titrated up. Chose effexor due to feeling fatigue and concern is her weight she states. The patient red flags and when to seek medical attention Followup in 3 weeks

## 2011-09-23 NOTE — Assessment & Plan Note (Signed)
Patient appears to still be potentially hypothyroid. Will patient back in 2 weeks to recheck TSH if still low will consider increasing 212 mcg we'll make no changes today.

## 2011-10-19 NOTE — Consult Note (Signed)
Signed late 

## 2011-12-02 ENCOUNTER — Other Ambulatory Visit: Payer: Self-pay | Admitting: Family Medicine

## 2011-12-15 ENCOUNTER — Telehealth: Payer: Self-pay | Admitting: Family Medicine

## 2011-12-15 NOTE — Telephone Encounter (Signed)
Patient is calling because she has a reocurring cyst in her breast and needs a referral to the Breast Center.  She is asking that if the appt is made for her, she is wanting it to be after 3:30.  Please call her with the information.

## 2011-12-15 NOTE — Telephone Encounter (Signed)
Called patient back to discuss this cyst. Will answer at home number. Called cell phone told it was wrong number. Please attempted to call home number didn't tell her that I would like to discuss this cyst as well as see her before I would refer her to the breast Center.

## 2011-12-16 ENCOUNTER — Ambulatory Visit: Payer: PRIVATE HEALTH INSURANCE | Admitting: Family Medicine

## 2011-12-16 ENCOUNTER — Encounter: Payer: Self-pay | Admitting: Family Medicine

## 2011-12-16 ENCOUNTER — Ambulatory Visit (INDEPENDENT_AMBULATORY_CARE_PROVIDER_SITE_OTHER): Payer: PRIVATE HEALTH INSURANCE | Admitting: Family Medicine

## 2011-12-16 VITALS — BP 126/81 | HR 82 | Temp 98.1°F | Ht 67.0 in | Wt 139.0 lb

## 2011-12-16 DIAGNOSIS — N611 Abscess of the breast and nipple: Secondary | ICD-10-CM

## 2011-12-16 DIAGNOSIS — F32A Depression, unspecified: Secondary | ICD-10-CM

## 2011-12-16 DIAGNOSIS — N6009 Solitary cyst of unspecified breast: Secondary | ICD-10-CM

## 2011-12-16 DIAGNOSIS — F341 Dysthymic disorder: Secondary | ICD-10-CM

## 2011-12-16 DIAGNOSIS — E039 Hypothyroidism, unspecified: Secondary | ICD-10-CM

## 2011-12-16 DIAGNOSIS — N61 Mastitis without abscess: Secondary | ICD-10-CM

## 2011-12-16 DIAGNOSIS — N6001 Solitary cyst of right breast: Secondary | ICD-10-CM

## 2011-12-16 DIAGNOSIS — F419 Anxiety disorder, unspecified: Secondary | ICD-10-CM

## 2011-12-16 MED ORDER — CEPHALEXIN 500 MG PO CAPS: 500.0000 mg | ORAL_CAPSULE | Freq: Four times a day (QID) | ORAL | Status: AC

## 2011-12-16 NOTE — Progress Notes (Signed)
Addended by: Jone Baseman D on: 12/16/2011 04:27 PM   Modules accepted: Orders

## 2011-12-16 NOTE — Progress Notes (Signed)
  Subjective:    Patient ID: Chelsea Medina, female    DOB: [birth year], 31 y.o.   MRN: [id number]  HPI 31 year old female coming in with a complaint of a breast cyst. Patient states it started- states that she has had this problem in the past last time approximately 6 months ago. Before 2011 she did need to have this cyst drained which is very painful. Change in size-it has seemed to be and larger Tender-yes Discharge-no Fever chills or weight loss?- No Past family history of breast cancer?- No  Hypothyroidism-patient denies any weight changes denies any hair loss denies any swelling that has had some anxiety out of norm.   Anxiety and depression- patient has a history of anxiety he was given Effexor at last visit the patient never filled the medication. Patient states that she feels like she is getting better overall but only at certain times where she feels overwhelmed. Patient denies any suicidal or homicidal ideation and declines any medications at this time. Patient given option for psychologist referral which she has declined as well.  Review of Systems Stated above in history of present illness    Objective:   Physical Exam Gen. no general distress Breast exam- right breast has a fist with mild erythema in the posterior position that is behind the nipple as well approximately 2 cm in diameter fluctuant very tender to palpation. Psych- patient's affect is good judgment is normal.      Assessment & Plan:

## 2011-12-16 NOTE — Assessment & Plan Note (Signed)
Discuss with patient at length patient though declined any medical or behavioral intervention at this time. Patient knows anytime she can come back and we will readdress the situation. We'll check TSH and T4 in case this is secondary to her thyroid disease.

## 2011-12-16 NOTE — Assessment & Plan Note (Addendum)
Likely more an infected cyst and abscess. At this time we'll send to ultrasound to have imaging done with possible aspiration. In the meantime patient given antibiotics to take daily in case it is infected. Schedule for ultrasound next Wednesday

## 2011-12-16 NOTE — Assessment & Plan Note (Signed)
Patient is due for TSH which will be drawn today. Will call patient with results and adjust accordingly.

## 2011-12-16 NOTE — Patient Instructions (Signed)
It is good to see you. I will send you to the breast Center for the ultrasound of the cyst but in the meantime I will give you an antibiotic to try. I want you to try hydrocortisone for the itching of the legs. We will check your thyroid today and I will call you with the results. If it is normal and you are still having anxiety issues please give you a call and we will talk about what we need to do.

## 2011-12-17 ENCOUNTER — Telehealth: Payer: Self-pay | Admitting: Family Medicine

## 2011-12-17 IMAGING — US US BREAST*R*
1 series · 14 of 20 positions shown · non-contrast
Comparison: None none available

CLINICAL DATA: Status post C-section.  History of MRSA.  History of
breast abscess.  The patient has tender breast mass and is able to
express purulent fluid from the nipple, different from colostrum.

ULTRASOUND RIGHT BREAST

[Series 1: us breast*right* · 0.07mm/px · 20 acquisitions, 14 frames shown]
[im 1/20]
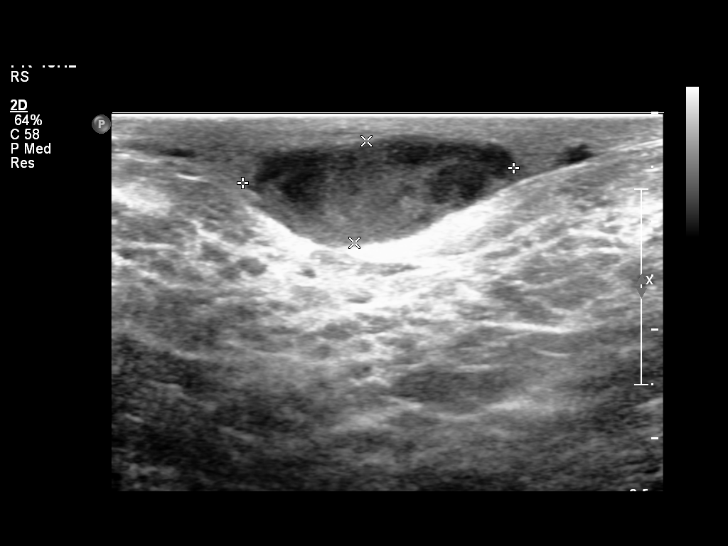
[im 3/20]
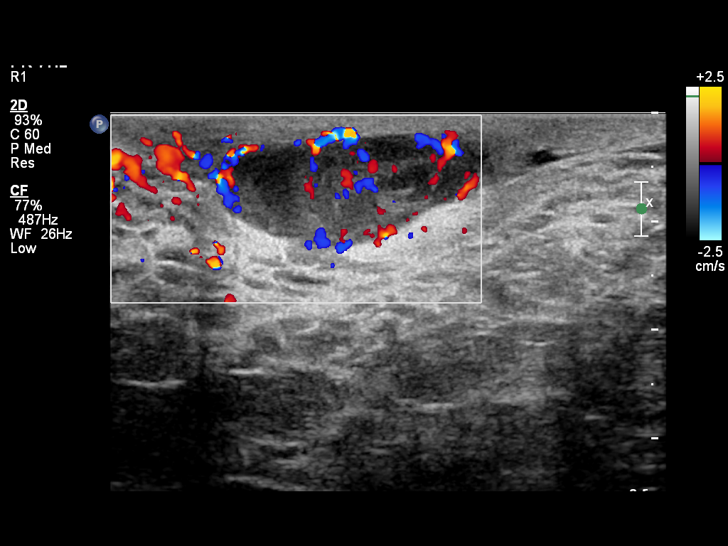
[im 4/20]
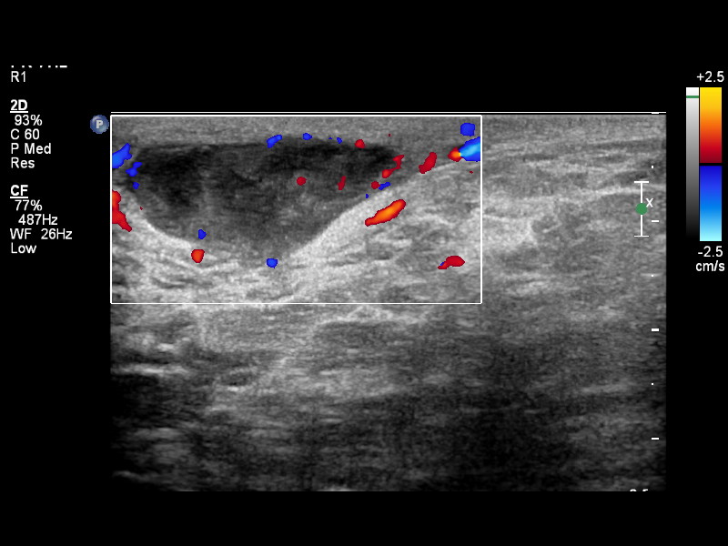
[im 6/20]
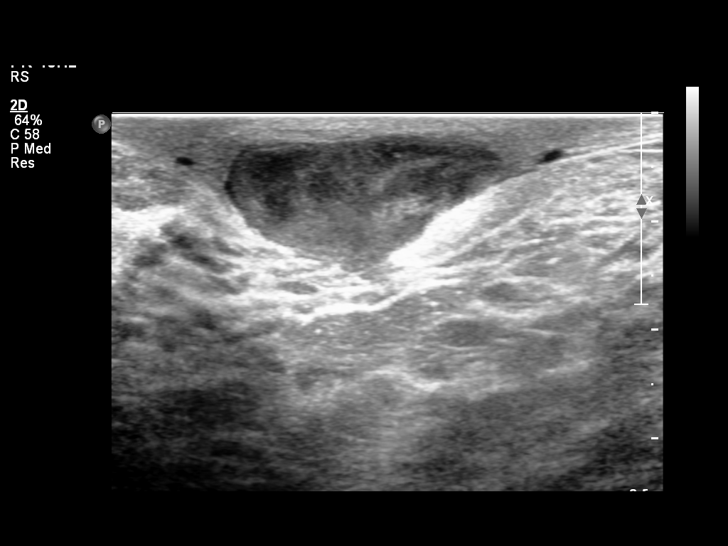
[im 7/20]
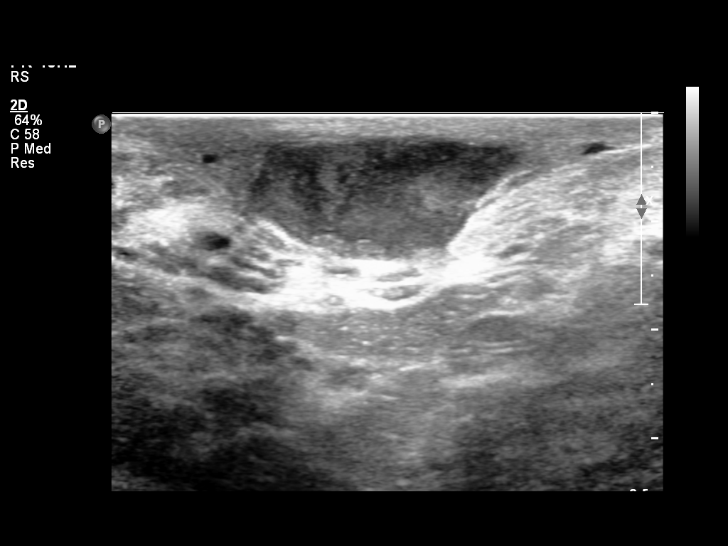
[im 8/20]
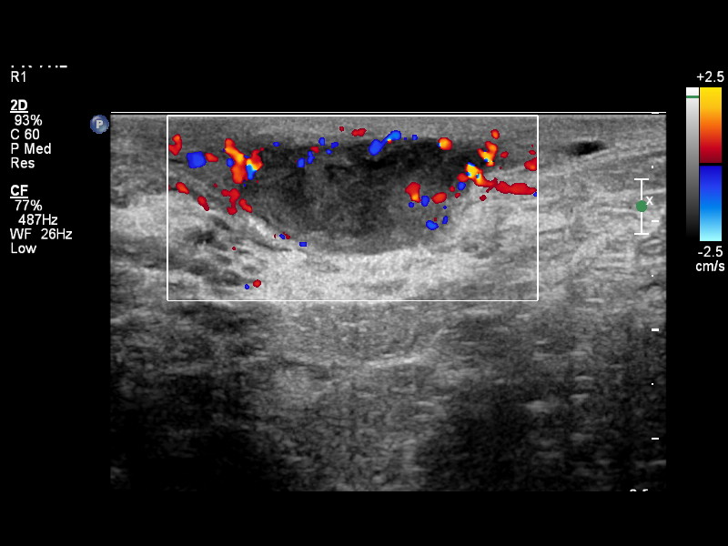
[im 10/20]
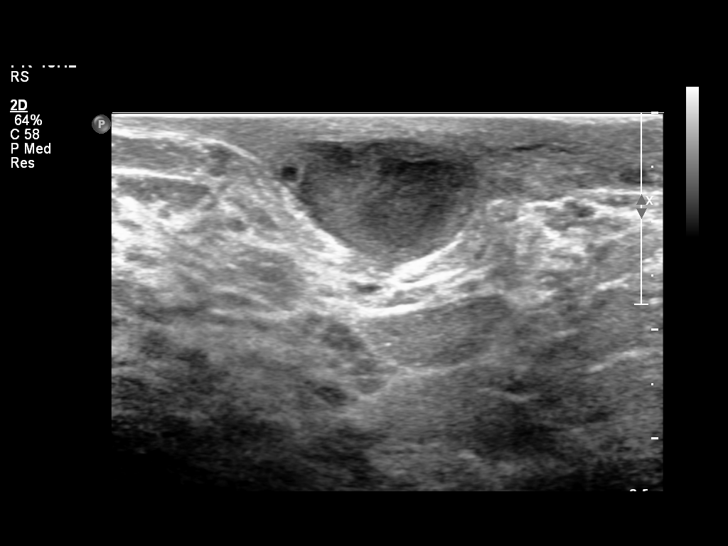
[im 11/20]
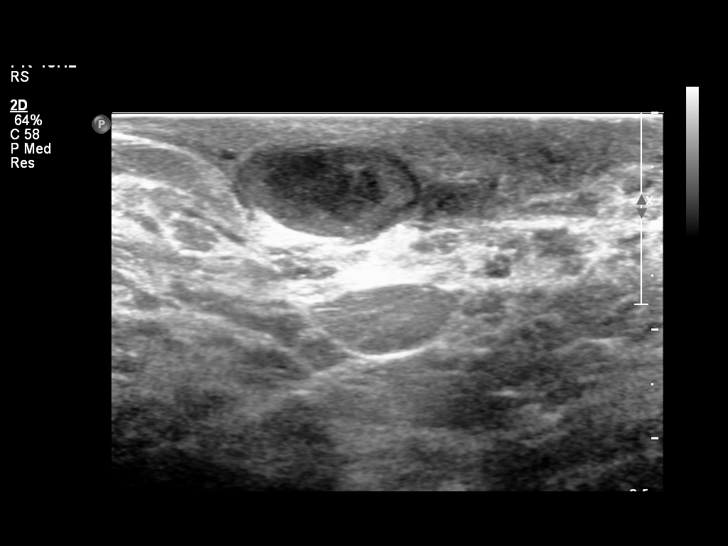
[im 13/20]
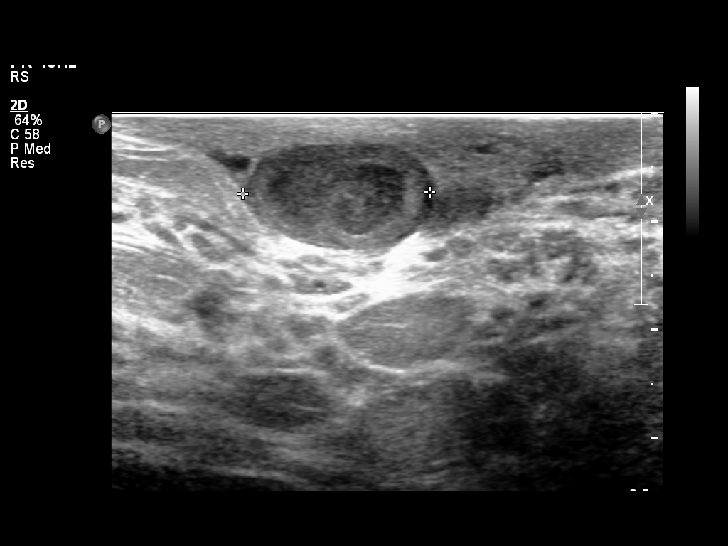
[im 14/20]
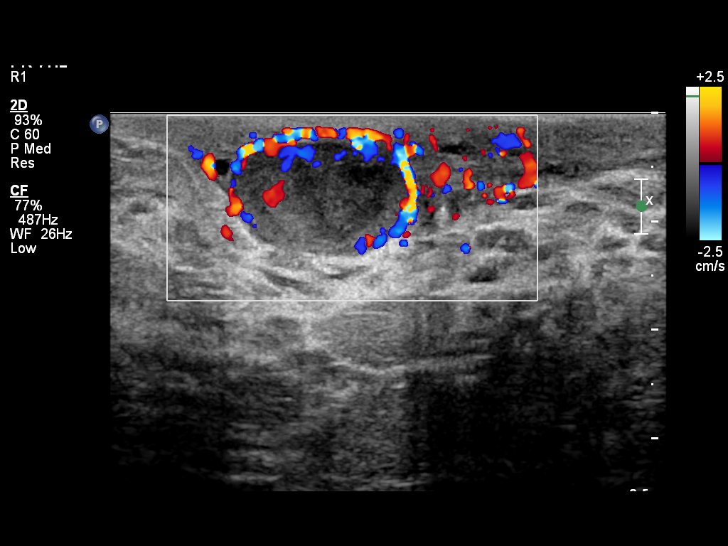
[im 16/20]
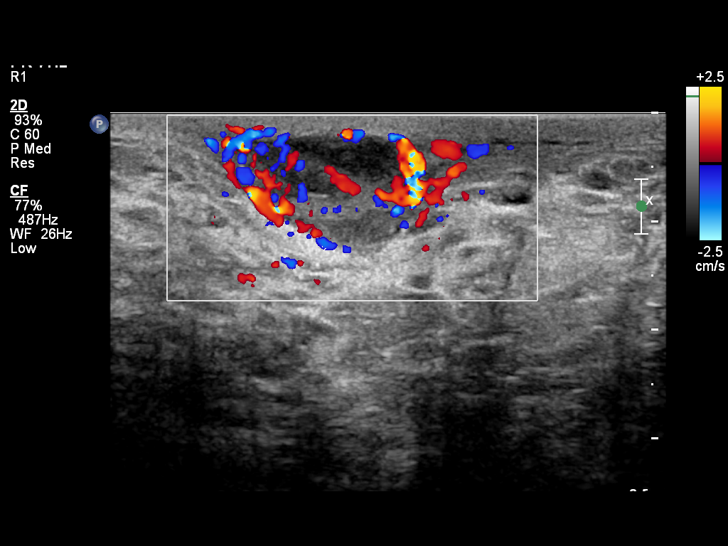
[im 17/20]
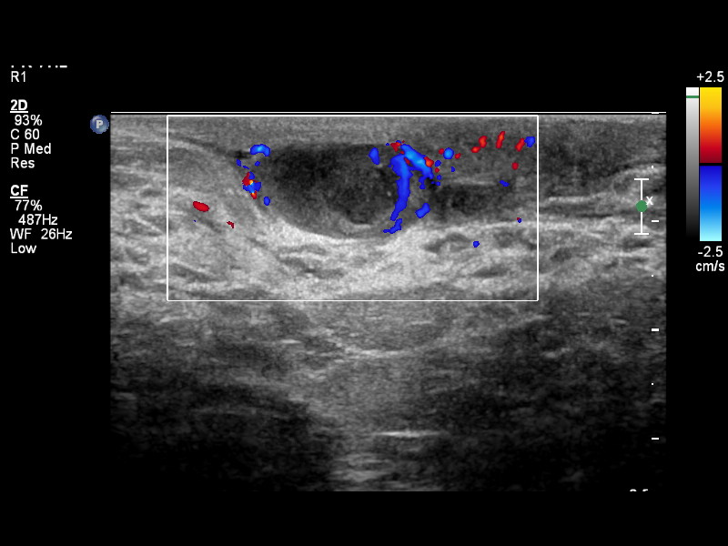
[im 18/20]
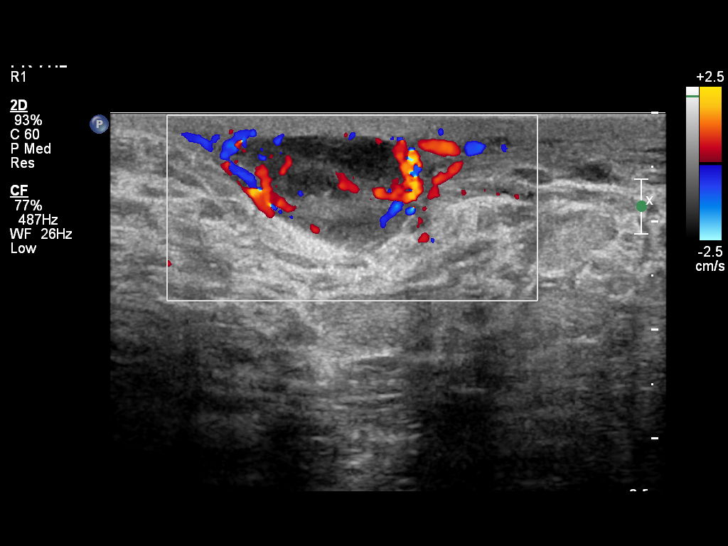
[im 20/20]
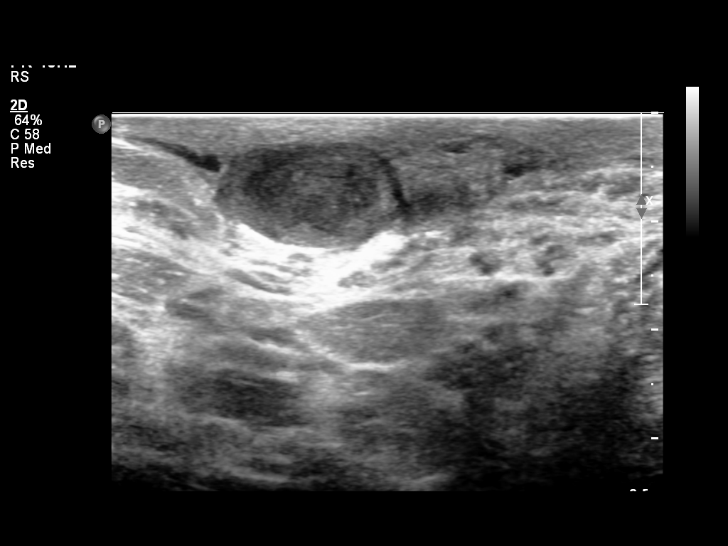

[14 of 20 positions shown; findings below may reference images not displayed]

FINDINGS: Ultrasound is performed by the technologist.  Dr. Heinen
was present for the exam.

Within the areola, 1 o'clock location, there is a mixed
echogenicity mass measuring 2.5 x 0.9 x 1.7 cm.  This demonstrates
internal brisk vascularity.  There is increased through
transmission.  Nodule appears circumscribed and confined to the
dermal layer.  Differential diagnosis includes abscess, ruptured
epidermoid cyst, [REDACTED]'s gland abscess.  Malignancy is not
excluded.
IMPRESSION: Solid, heterogeneous mass within the right areola.  Surgical
consultation is recommended regarding possible excision.

BI-RADS CATEGORY 4:  Suspicious abnormality - biopsy should be
considered.

## 2011-12-17 NOTE — Telephone Encounter (Signed)
Called patient gave lab results, all normal. Left message.

## 2011-12-23 ENCOUNTER — Other Ambulatory Visit: Payer: PRIVATE HEALTH INSURANCE

## 2012-01-14 ENCOUNTER — Ambulatory Visit
Admission: RE | Admit: 2012-01-14 | Discharge: 2012-01-14 | Disposition: A | Discharge status: Home / Self Care | Payer: PRIVATE HEALTH INSURANCE | Source: Ambulatory Visit | Attending: Family Medicine | Admitting: Family Medicine

## 2012-01-14 DIAGNOSIS — N6001 Solitary cyst of right breast: Secondary | ICD-10-CM

## 2012-01-15 ENCOUNTER — Encounter (INDEPENDENT_AMBULATORY_CARE_PROVIDER_SITE_OTHER): Payer: PRIVATE HEALTH INSURANCE | Admitting: Surgery

## 2012-01-18 ENCOUNTER — Ambulatory Visit (INDEPENDENT_AMBULATORY_CARE_PROVIDER_SITE_OTHER): Payer: PRIVATE HEALTH INSURANCE | Admitting: Surgery

## 2012-01-18 ENCOUNTER — Other Ambulatory Visit (INDEPENDENT_AMBULATORY_CARE_PROVIDER_SITE_OTHER): Payer: Self-pay | Admitting: Surgery

## 2012-01-18 ENCOUNTER — Encounter (INDEPENDENT_AMBULATORY_CARE_PROVIDER_SITE_OTHER): Payer: Self-pay | Admitting: Surgery

## 2012-01-18 VITALS — BP 128/86 | HR 75 | Temp 98.5°F | Resp 14 | Ht 67.0 in | Wt 138.8 lb

## 2012-01-18 DIAGNOSIS — N61 Mastitis without abscess: Secondary | ICD-10-CM

## 2012-01-18 DIAGNOSIS — N611 Abscess of the breast and nipple: Secondary | ICD-10-CM

## 2012-01-18 NOTE — Progress Notes (Addendum)
[facility]  Ovidio Kin, MD,  FACS [address] [place]  [address] [place], [place]    [address] Phone:  [phone] FAX:  [phone]   Re:   Chelsea Medina DOB:   [birth year] MRN:   [id number]  Urgent Office  ASSESSMENT AND PLAN: 1.  Recurrent right breast abscess  Probable infected ductal system at 1 - 2 o'clock with draining sinus at edge of areola.   Discussed management.  Because of recurrence of the infection, she needs to have this ductal system excised.  I explained both open and closed approaches.  I discussed the risks which include bleeding, infection, recurrence, and nerve injury.  She will finish the antibiotics.  We will give it  2 to 4 weeks to heal.  And I will schedule her surgery in August.  2.  History of anxiety and depression  She blames this in part on being hypothyroid. 3.  Smoking  She knows this is bad for her health. 4.  Hypothyroid.  On replacement and is doing better. 5.  She did have a MRSA, PCR positive on 8/29/201, though I have had trouble correlating why she got the test.  HISTORY OF PRESENT ILLNESS: Chief Complaint  Patient presents with  . Abscess    urge- eval rt br abscess    Chelsea Medina is a 31 y.o. (DOB: 1980/11/02)  white female who is a patient of Marikay Alar, MD (has seen Dr. Antoine Primas at [facility]) and comes to me today for right breast abscess (recurrent).  The patient has had a recurrent right breast abscess for about 4 years.  She saw Dr. Dwain Sarna in 04/02/2010 for the same problem, but she was breast feeding.  She "no showed" for a 06/03/2010 appt because the abscess was better.  She had another flareup on Thanksgiving 2012. The abscess drained spontaneously. It took about a month heal. She did well till around [facility] Day 2013.   The she's had another bout with a right breast abscess. She had an ultrasound of her right breast on 01/14/2012.  She was placed on Doxycycline 01/16/2012.  She had a  history of the abscess at an earlier time as being MRSA positive.  Social history: Married. Has three children. 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  PHYSICAL EXAM: BP 128/86  Pulse 75  Temp 98.5 F (36.9 C) (Temporal)  Resp 14  Ht 5\' 7"  (1.702 m)  Wt 138 lb 12.8 oz (62.959 kg)  BMI 21.74 kg/m2  LMP 12/24/2011  HEENT:  Pupils equal.  Dentition good.  No injury. NECK:  Supple.  No thyroid mass. LYMPH NODES:  No cervical, supraclavicular, or axillary adenopathy. BREASTS -  RIGHT:  2.5 cm mass at 1 to 2 o'clock with draining infection.  The is no other mass in the breast.  This is consistent with a recurrent ductal abscess.  I did an US of the area [pictures in chart]   LEFT:  No palpable mass or nodule.  No nipple discharge. UPPER EXTREMITIES:  No evidence of lymphedema.  DATA REVIEWED: [place] report.   Ovidio Kin, MD, FACS Office:  [phone]

## 2012-03-16 ENCOUNTER — Encounter (INDEPENDENT_AMBULATORY_CARE_PROVIDER_SITE_OTHER): Payer: PRIVATE HEALTH INSURANCE | Admitting: Surgery

## 2012-04-06 ENCOUNTER — Other Ambulatory Visit: Payer: Self-pay | Admitting: *Deleted

## 2012-04-06 ENCOUNTER — Other Ambulatory Visit: Payer: Self-pay | Admitting: Family Medicine

## 2012-04-06 MED ORDER — FLUTICASONE PROPIONATE 50 MCG/ACT NA SUSP: 1.0000 | Freq: Every day | NASAL | Status: DC

## 2012-04-06 MED ORDER — FLUTICASONE PROPIONATE 50 MCG/ACT NA SUSP: 1.0000 | Freq: Every day | NASAL | Status: AC

## 2012-04-06 NOTE — Telephone Encounter (Signed)
Patient requesting refill on fluticasone.  This medication was refilled for her.

## 2012-04-22 ENCOUNTER — Other Ambulatory Visit (INDEPENDENT_AMBULATORY_CARE_PROVIDER_SITE_OTHER): Payer: Self-pay | Admitting: Surgery

## 2012-04-22 DIAGNOSIS — N6089 Other benign mammary dysplasias of unspecified breast: Secondary | ICD-10-CM

## 2012-05-04 ENCOUNTER — Encounter (INDEPENDENT_AMBULATORY_CARE_PROVIDER_SITE_OTHER): Payer: PRIVATE HEALTH INSURANCE | Admitting: Surgery

## 2012-05-12 ENCOUNTER — Telehealth: Payer: Self-pay | Admitting: Family Medicine

## 2012-05-12 NOTE — Telephone Encounter (Signed)
Pt is needing to have her thyroid checked again and needs orders for the lab - pls advise  Her son has an appt w/ [facility] on Monday and would like to have labs done then.

## 2012-05-13 NOTE — Telephone Encounter (Signed)
LMOVM for pt to call and schedule an appt. Keyonda Bickle Dawn  

## 2012-05-13 NOTE — Telephone Encounter (Signed)
Patient should be advised to make an appointment to follow-up on this issue as I have never seen her before.  I will certainly order her thyroid function tests once I see her in follow-up.  Thanks.

## 2012-05-17 ENCOUNTER — Encounter (INDEPENDENT_AMBULATORY_CARE_PROVIDER_SITE_OTHER): Payer: Self-pay | Admitting: Surgery

## 2012-06-21 ENCOUNTER — Encounter: Payer: Self-pay | Admitting: Family Medicine

## 2012-06-21 ENCOUNTER — Ambulatory Visit (INDEPENDENT_AMBULATORY_CARE_PROVIDER_SITE_OTHER): Payer: PRIVATE HEALTH INSURANCE | Admitting: Family Medicine

## 2012-06-21 VITALS — BP 131/80 | HR 90 | Temp 98.5°F | Ht 67.0 in | Wt 138.0 lb

## 2012-06-21 DIAGNOSIS — J020 Streptococcal pharyngitis: Secondary | ICD-10-CM

## 2012-06-21 DIAGNOSIS — N611 Abscess of the breast and nipple: Secondary | ICD-10-CM

## 2012-06-21 DIAGNOSIS — J029 Acute pharyngitis, unspecified: Secondary | ICD-10-CM

## 2012-06-21 DIAGNOSIS — E039 Hypothyroidism, unspecified: Secondary | ICD-10-CM

## 2012-06-21 DIAGNOSIS — B351 Tinea unguium: Secondary | ICD-10-CM

## 2012-06-21 DIAGNOSIS — N61 Mastitis without abscess: Secondary | ICD-10-CM

## 2012-06-21 MED ORDER — PENICILLIN V POTASSIUM 500 MG PO TABS: 500.0000 mg | ORAL_TABLET | Freq: Four times a day (QID) | ORAL | Status: AC

## 2012-06-21 NOTE — Assessment & Plan Note (Signed)
Recurrent issue following previous surgery for this issue. Is currently draining and no evidence of surrounding cellulitis. Plan: patient is to call for follow-up appointment with [facility] to have this re-evaluated.

## 2012-06-21 NOTE — Progress Notes (Signed)
  Subjective:    Patient ID: Chelsea Medina, female    DOB: 1980-12-20, 31 y.o.   MRN: [id number]  HPI Patient is a 31 yo female that presents for evaluation of hypothyroidism, cyst on breast, sore throat, and toe pain.  Hypothyroid: patient is taking synthroid 100 mcg daily. Endorses being tired, but difficult for her to know if this is due to thyroid or to having 3 kids. Denies issues with medication. Denies palpitations.  Cyst on right breast: has been an issue for 4-7 years. 3 months ago had surgery to remove duct that had some damage. States was better until 4 days ago when noticed swelling. Drained today while in the shower. Green/yellow purulent material. Now with open draining tract. States given antibiotics for this in the past. Denies pain with this currently.  Sore throat: for 2 days. Denies congestion, headache, nasal drainage.  States is eating and drinking ok. 3 family members with strep throat.  Toe pain: left big toe been painful for past 6 months, feels like it is discolored.  Feels like it is coming off as well. Has area on medial aspect of toe that appears to be destroyed.    Review of Systems see HPI     Objective:   Physical Exam  Constitutional: She appears well-developed and well-nourished.  HENT:  Head: Normocephalic and atraumatic.       Oropharynx erythematous and left tonsil enlarged  Neck: Neck supple. No thyromegaly present.  Lymphadenopathy:    She has no cervical adenopathy.  Skin:       Right breast: firm area located just under the skin in the medial upper quadrant above the nipple. There is a opening that is draining bloody pus discharge. Scar is apparent from previous surgery. No skin erythema noted. Nontender to palpation Left Big toe: nail appears thickened and browner in color than other nails. Appears to have an area on the medial aspect of the nail that is eaten away.          Assessment & Plan:

## 2012-06-21 NOTE — Assessment & Plan Note (Signed)
Patient with sore throat due to strep pharyngitis. Plan: treat with penicillin for 10 days. Note given for work.

## 2012-06-21 NOTE — Assessment & Plan Note (Signed)
Patient with what appears to be fungal infection of left big toe. Plan: discussed removal of nail and culture to prove fungal infection and patient denied doing this. Also, discussed medication for this issue and patient declined as well. Chose to watch this at this time.

## 2012-06-21 NOTE — Patient Instructions (Signed)
Nice to meet you today. Please call your surgeon regarding your breast abscess. I have sent a penicillin prescription to your pharmacy.  Please take the entire course of this medication. We will check your TSH today and I will call you with the results. Thank you for your visit today.

## 2012-06-21 NOTE — Assessment & Plan Note (Signed)
Symptomatically appears stable. Plan: check TSH, plan to adjust synthroid dosage accordingly. Will call patient with results.

## 2012-06-23 ENCOUNTER — Telehealth: Payer: Self-pay | Admitting: Family Medicine

## 2012-06-23 DIAGNOSIS — E039 Hypothyroidism, unspecified: Secondary | ICD-10-CM

## 2012-06-23 MED ORDER — LEVOTHYROXINE SODIUM 100 MCG PO TABS: 100.0000 ug | ORAL_TABLET | Freq: Every day | ORAL | Status: DC

## 2012-06-23 NOTE — Telephone Encounter (Signed)
Called patient with the results of her normal TSH. Refilled synthroid prescription as well.

## 2012-08-08 ENCOUNTER — Telehealth: Payer: Self-pay | Admitting: Family Medicine

## 2012-08-08 DIAGNOSIS — L309 Dermatitis, unspecified: Secondary | ICD-10-CM

## 2012-08-08 MED ORDER — TRIAMCINOLONE ACETONIDE 0.5 % EX CREA: TOPICAL_CREAM | Freq: Three times a day (TID) | CUTANEOUS | Status: AC

## 2012-08-08 NOTE — Telephone Encounter (Signed)
Patient said that she was seen a while back and that a cream for dry skin was supposed to be sent to her pharmacy and never was sent.  She uses [facility] on [address].

## 2012-08-08 NOTE — Telephone Encounter (Signed)
Filled patients kenalog cream that appears to have never been sent to the pharmacy. If this continues to bother the patient advise her to schedule a follow-up appointment.  Please call the patient to advise of this. Thanks.

## 2012-08-08 NOTE — Telephone Encounter (Signed)
Pt informed. Fleeger, Jessica Dawn  

## 2012-10-25 ENCOUNTER — Other Ambulatory Visit: Payer: Self-pay | Admitting: Family Medicine

## 2012-10-25 NOTE — Telephone Encounter (Signed)
Refilled synthroid for 4 months.

## 2012-12-27 ENCOUNTER — Telehealth: Payer: Self-pay | Admitting: *Deleted

## 2012-12-27 NOTE — Telephone Encounter (Signed)
Fax from pharmacy to change levothyroxin to generic sandoz - please send new script if this is approved. Wyatt Haste, RN-BSN

## 2012-12-28 NOTE — Telephone Encounter (Signed)
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 Just wanted to make sure this was the correct medication.

## 2013-01-02 ENCOUNTER — Telehealth: Payer: Self-pay | Admitting: Family Medicine

## 2013-01-02 MED ORDER — LEVOTHYROXINE SODIUM 100 MCG PO TABS: 100.0000 ug | ORAL_TABLET | Freq: Every day | ORAL | Status: DC

## 2013-01-02 NOTE — Telephone Encounter (Signed)
Medication called in per MD request.  Radene Ou, CMA

## 2013-01-02 NOTE — Telephone Encounter (Signed)
Refilled patients levothyroxine per pharmacy request allowing use of formulary alternative. Levothyroxine 100 mcg PO daily with breakfast #30 with 3 refills.  Please call this Rx in and advise the patient of the change in medications. Thanks.

## 2013-03-10 ENCOUNTER — Ambulatory Visit (INDEPENDENT_AMBULATORY_CARE_PROVIDER_SITE_OTHER): Payer: PRIVATE HEALTH INSURANCE | Admitting: Family Medicine

## 2013-03-10 ENCOUNTER — Encounter: Payer: Self-pay | Admitting: Family Medicine

## 2013-03-10 VITALS — BP 125/60 | HR 82 | Temp 99.2°F | Ht 67.0 in | Wt 136.0 lb

## 2013-03-10 DIAGNOSIS — R6889 Other general symptoms and signs: Secondary | ICD-10-CM

## 2013-03-10 DIAGNOSIS — E039 Hypothyroidism, unspecified: Secondary | ICD-10-CM

## 2013-03-10 DIAGNOSIS — R0989 Other specified symptoms and signs involving the circulatory and respiratory systems: Secondary | ICD-10-CM

## 2013-03-10 NOTE — Patient Instructions (Signed)
-   we will set up the thyroid ultrasound for you  - I will check labs today - once they are back, we will refill your RX for 90 days

## 2013-03-10 NOTE — Progress Notes (Signed)
Subjective:     Patient ID: Chelsea Medina, female   DOB: November 06, 1980, 32 y.o.   MRN: [id number]  HPI  32 yo who is here for a full feeling in her throat.   - pressure feeling in her throat at the base of her neck-feels numb - has a feeling of fullness even in her lymph nodes - comes and goes  - usually happens every 10 minutes or so - lasts for seconds - doesn't bother her to swallow. No dysphagia.    With these episodes she doesn't feel flushed, lightheaded, nauseated or dizzy  Smokes cigarrettes- has for years Has a hx of hypothyroidism- no hx of goiter. Last TSH normal 06/2012.      Review of Systems See above     Objective:   Physical Exam GEN: NAD,  HEENT:  Oropharynx with some mild cobblestoning. No edema or mass. Nares patent. No lymphadenopathy of the submandibular, cervical or supraclavicular chains.  Thyroid with only mild ?enlargement on left as compared to right. No palpable nodules.   CV: RRR PULM: LCTAB    Assessment:     Throat fullness - Plan: TSH, T4, free, US Soft Tissue Head/Neck  Hypothyroidism - Plan: TSH, T4, free, US Soft Tissue Head/Neck      Plan:     - throat fullness - unclear etiology. No explanation on exam. Could be related to thyroid.  - will send for thyroid US - check TSH and free T4 also - if all normal and sensation continues, may consider referral to ENT down the line but will defer further management to PCP.

## 2013-03-13 ENCOUNTER — Other Ambulatory Visit: Payer: Self-pay | Admitting: Family Medicine

## 2013-03-13 ENCOUNTER — Telehealth: Payer: Self-pay | Admitting: Family Medicine

## 2013-03-13 MED ORDER — LEVOTHYROXINE SODIUM 100 MCG PO TABS: 100.0000 ug | ORAL_TABLET | Freq: Every day | ORAL | Status: DC

## 2013-03-13 NOTE — Telephone Encounter (Signed)
Tried to call pt and let her know that labs were normal and that her medication refills were sent to pharmacy.  Jazmin Hartsell,CMA

## 2013-03-13 NOTE — Telephone Encounter (Signed)
Pt called because she said that she received a call from [place]. I didn't see where anyone called and she thought that maybe the doctor was calling her about test results. JW

## 2013-03-14 ENCOUNTER — Ambulatory Visit (HOSPITAL_COMMUNITY)
Admission: RE | Admit: 2013-03-14 | Discharge: 2013-03-14 | Disposition: A | Discharge status: Home / Self Care | Payer: PRIVATE HEALTH INSURANCE | Source: Ambulatory Visit | Attending: Family Medicine | Admitting: Family Medicine

## 2013-03-14 DIAGNOSIS — E039 Hypothyroidism, unspecified: Secondary | ICD-10-CM | POA: Insufficient documentation

## 2013-03-14 DIAGNOSIS — R6889 Other general symptoms and signs: Secondary | ICD-10-CM

## 2013-03-15 ENCOUNTER — Telehealth: Payer: Self-pay | Admitting: Family Medicine

## 2013-03-15 NOTE — Telephone Encounter (Signed)
LMOM advising pt to rt our call. Please give pt result.

## 2013-03-15 NOTE — Telephone Encounter (Signed)
Message copied by Barnie Alderman on Wed Mar 15, 2013  1:27 PM ------      Message from: Vale Haven      Created: Wed Mar 15, 2013 12:53 PM       Would one of you mind letting her know that her ultrasound was 100% normal?  I don't think we need to go into further intervention unless she finds that the feeling in her neck worsens or changes.  Thanks! ------

## 2013-03-16 ENCOUNTER — Telehealth: Payer: Self-pay | Admitting: *Deleted

## 2013-03-16 NOTE — Telephone Encounter (Signed)
LMOVM for pt to return call .Fleeger, Jessica Dawn  

## 2013-03-16 NOTE — Telephone Encounter (Signed)
Message copied by Osborne Oman on Thu Mar 16, 2013  9:54 AM ------      Message from: Vale Haven      Created: Wed Mar 15, 2013 12:53 PM       Would one of you mind letting her know that her ultrasound was 100% normal?  I don't think we need to go into further intervention unless she finds that the feeling in her neck worsens or changes.  Thanks! ------

## 2013-06-24 ENCOUNTER — Other Ambulatory Visit: Payer: Self-pay | Admitting: Family Medicine

## 2013-07-27 ENCOUNTER — Other Ambulatory Visit: Payer: Self-pay | Admitting: Family Medicine

## 2013-09-27 ENCOUNTER — Other Ambulatory Visit: Payer: Self-pay | Admitting: Family Medicine

## 2013-10-25 ENCOUNTER — Other Ambulatory Visit: Payer: Self-pay | Admitting: Family Medicine

## 2013-11-29 ENCOUNTER — Other Ambulatory Visit: Payer: Self-pay | Admitting: Family Medicine

## 2014-07-02 ENCOUNTER — Other Ambulatory Visit: Payer: Self-pay | Admitting: Family Medicine

## 2014-07-02 NOTE — Telephone Encounter (Signed)
Refill given for one month. It has been over a year since the patient was seen for her hypothyroidism. Please inform her that she needs to be seen for follow-up in the next month to check a TSH. Thanks.

## 2014-07-02 NOTE — Telephone Encounter (Signed)
LMOVM for pt to return call.  Please inform of the below. Fleeger, Jessica Dawn  

## 2014-07-24 ENCOUNTER — Ambulatory Visit (INDEPENDENT_AMBULATORY_CARE_PROVIDER_SITE_OTHER): Payer: PRIVATE HEALTH INSURANCE | Admitting: Family Medicine

## 2014-07-24 ENCOUNTER — Encounter: Payer: Self-pay | Admitting: Family Medicine

## 2014-07-24 VITALS — BP 131/82 | HR 80 | Temp 98.2°F | Ht 67.0 in | Wt 136.0 lb

## 2014-07-24 DIAGNOSIS — E039 Hypothyroidism, unspecified: Secondary | ICD-10-CM

## 2014-07-24 LAB — TSH: TSH: 14.207 u[IU]/mL — ABNORMAL HIGH (ref 0.350–4.500)

## 2014-07-24 MED ORDER — LEVOTHYROXINE SODIUM 100 MCG PO TABS: ORAL_TABLET | ORAL | Status: DC

## 2014-07-24 NOTE — Patient Instructions (Signed)
Nice to see you. We will call with the results of your blood work.

## 2014-07-25 ENCOUNTER — Telehealth: Payer: Self-pay | Admitting: *Deleted

## 2014-07-25 MED ORDER — LEVOTHYROXINE SODIUM 112 MCG PO TABS: ORAL_TABLET | ORAL | Status: DC

## 2014-07-25 NOTE — Progress Notes (Signed)
Patient ID: Chelsea Medina, female   DOB: 11/09/1980, 34 y.o.   MRN: [id number]  Marikay AlarEric Charlee Whitebread, MD Phone: [phone]  Chelsea Medina is a 34 y.o. female who presents today for f/u.  HYPOTHYROIDISM Disease Monitoring Weight changes: no  Skin Changes: no Palpitations: no Heat/Cold intolerance: no  Medication Monitoring Compliance:  Taking daily   Last TSH:   Lab Results  Component Value Date   TSH 14.207* 07/24/2014     Patient is a former smoker.   ROS: Per HPI   Physical Exam Filed Vitals:   07/24/14 1628  BP: 131/82  Pulse: 80  Temp: 98.2 F (36.8 C)    Gen: Well NAD HEENT: PERRL,  MMM Lungs: CTABL Nl WOB Heart: RRR  Neck: normal thyroid   Assessment/Plan: Please see individual problem list.  Marikay AlarEric Arin Vanosdol, MD [facility] PGY-3

## 2014-07-25 NOTE — Assessment & Plan Note (Signed)
TSH elevated. Patient asymptomatic, though appears under treated based on TSH. Will increase synthroid to 112 mcg.

## 2014-07-25 NOTE — Telephone Encounter (Signed)
-----   Message from Glori LuisEric G Sonnenberg, MD sent at 07/25/2014 12:33 PM EST ----- Please call the patient to inform that her thyroid test is elevated. We need to increase her synthroid. Will send this in for the patient. Please inform her of this.

## 2014-07-25 NOTE — Telephone Encounter (Signed)
LM for patient on her VM with message from MD.  Burnard HawthorneJazmin Analya Louissaint,CMA

## 2014-07-26 ENCOUNTER — Telehealth: Payer: Self-pay | Admitting: Family Medicine

## 2014-07-26 NOTE — Telephone Encounter (Signed)
Attempted to call patient to discuss results. Her TSH was 14.207. This was increased from 1.979 in August of 2014. This is likely related to a decreased rate of absorption of the medication potentially related to her diet or acid content of her stomach. Please call the patient to inform of this. If she has any further questions please let me know.

## 2014-07-26 NOTE — Telephone Encounter (Signed)
Will forward to MD to explain cause of this elevation. Jazmin Hartsell,CMA

## 2014-07-26 NOTE — Telephone Encounter (Signed)
Please call patient back to discuss cause of elevation of tsh level and what were the actual level numbers.

## 2014-07-27 NOTE — Telephone Encounter (Signed)
LM for patient to call back to receive explanation of tsh elevation. Fenix Ruppe,CMA

## 2014-08-03 NOTE — Telephone Encounter (Signed)
Spoke with pt and relayed the below message.  She wants to know the following:  1. Can she be referred to a specialist.   2.  Can she change her diet to changed the absorption rate.  She would like MD to call, and if he does not reach her to leave a detailed message. Aryan Sparks, Maryjo RochesterJessica Dawn

## 2014-08-03 NOTE — Telephone Encounter (Signed)
Called patient and left a message. I advised of dietary things that can effect synthroid absorption or thyroid hormone production including alcohol and coffee consumption, excess fiber supplements, processed, fatty, or salty foods, and soy. I do not think she needs to see a specialist for this issue and advised this on the message, though if she requests a referral I am more than happy to place this for her. I advised her to call back if she has any questions.

## 2014-12-08 IMAGING — US US SOFT TISSUE HEAD/NECK
1 series · 14 of 25 positions shown · non-contrast
Comparison: None

CLINICAL DATA: Hypothyroidism.

ULTRASOUND OF HEAD/NECK SOFT TISSUES
TECHNIQUE: Ultrasound examination of the head and neck soft
tissues was performed in the area of clinical concern.

[Series 1: us soft tissue head/neck · 0.08mm/px · 14 of 34 slices shown]
[im 1/34]
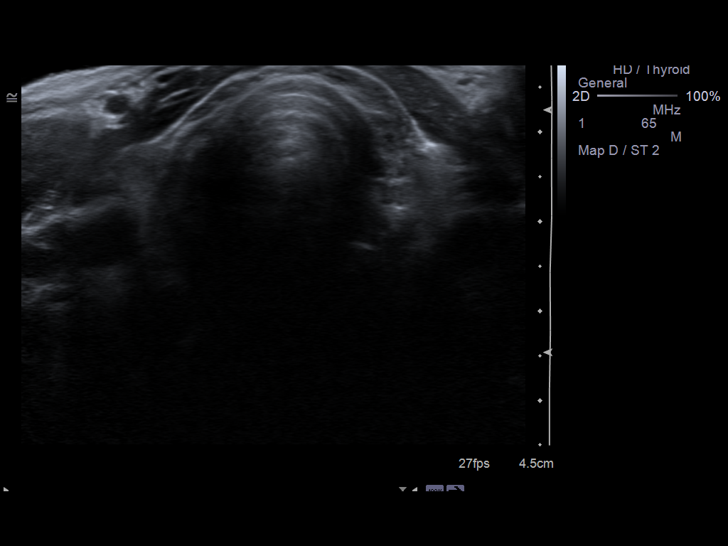
[im 3/34]
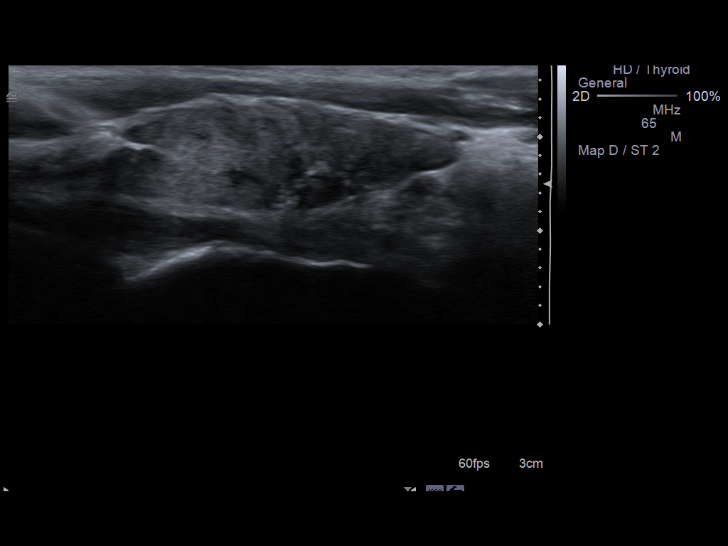
[im 6/34]
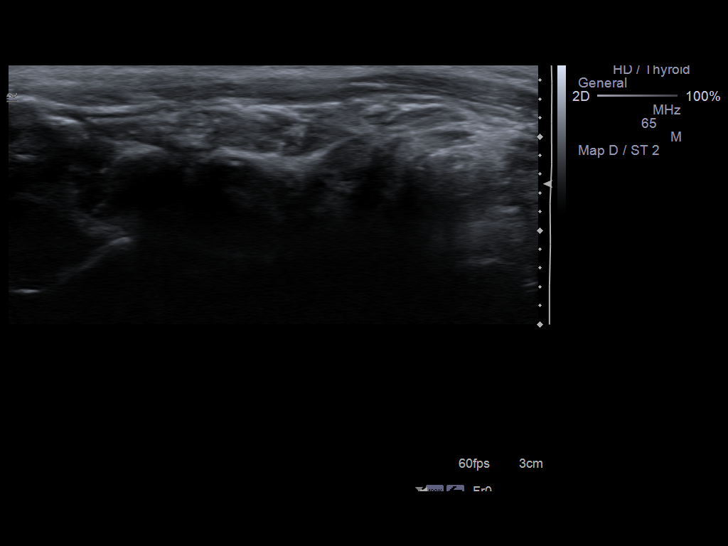
[im 9/34]
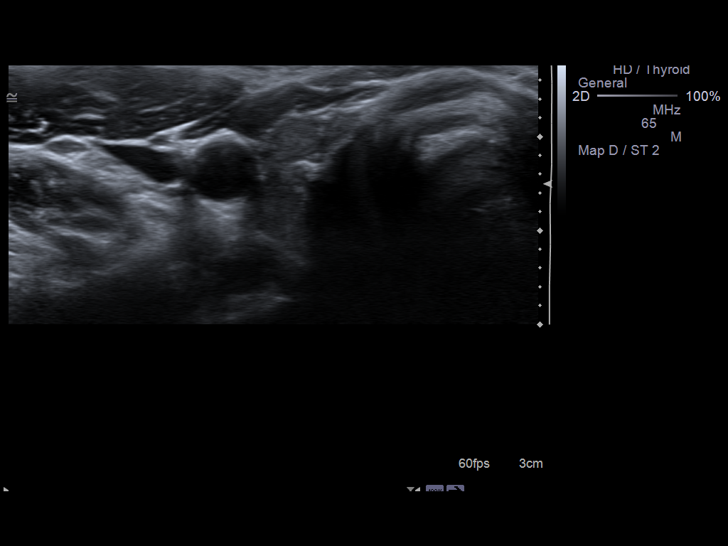
[im 12/34]
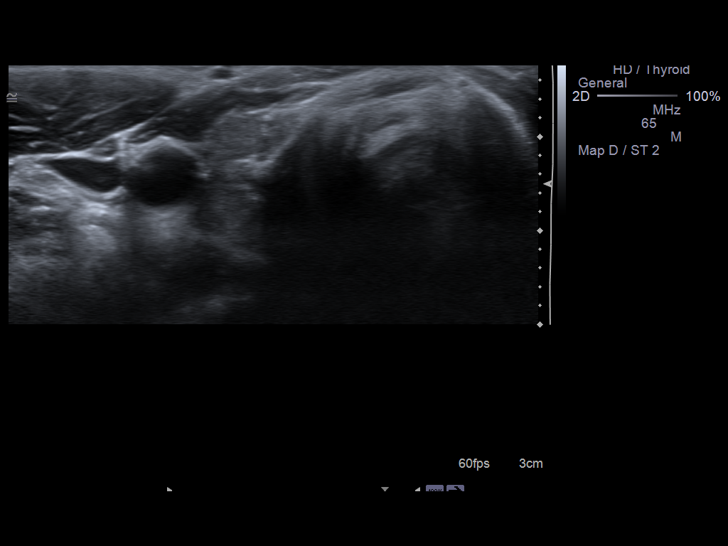
[im 13/34]
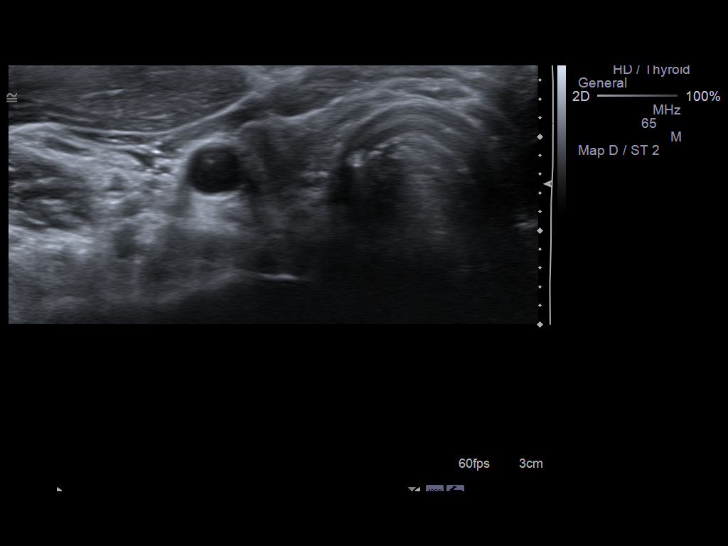
[im 16/34]
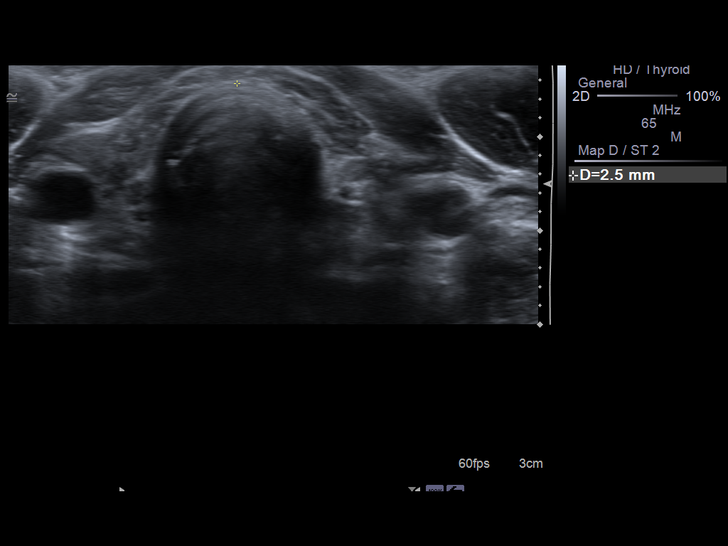
[im 18/34]
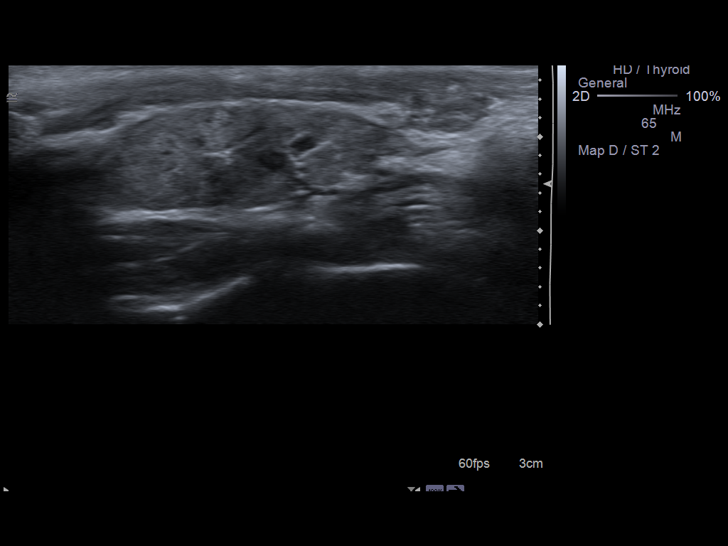
[im 21/34]
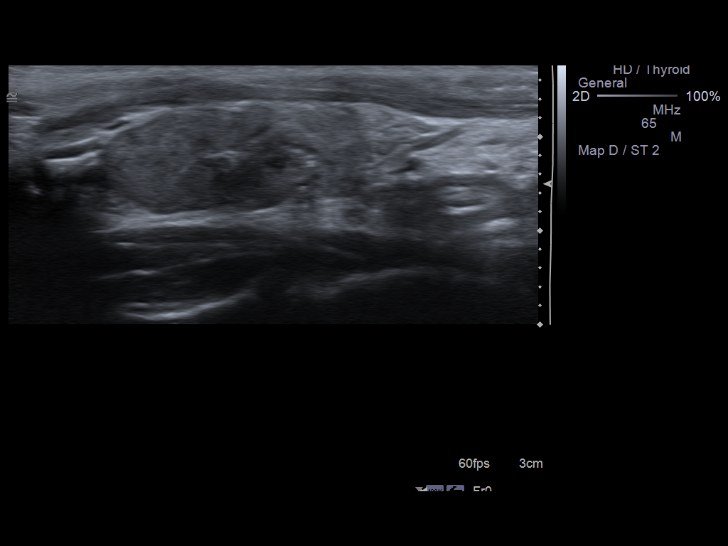
[im 23/34]
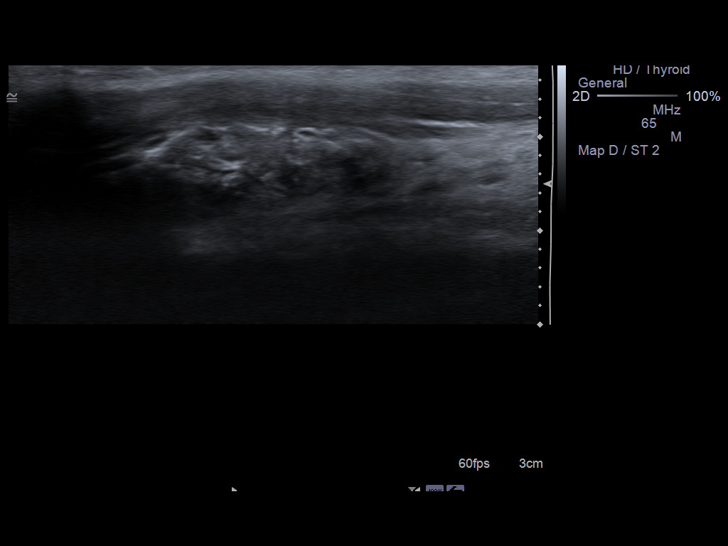
[im 25/34]
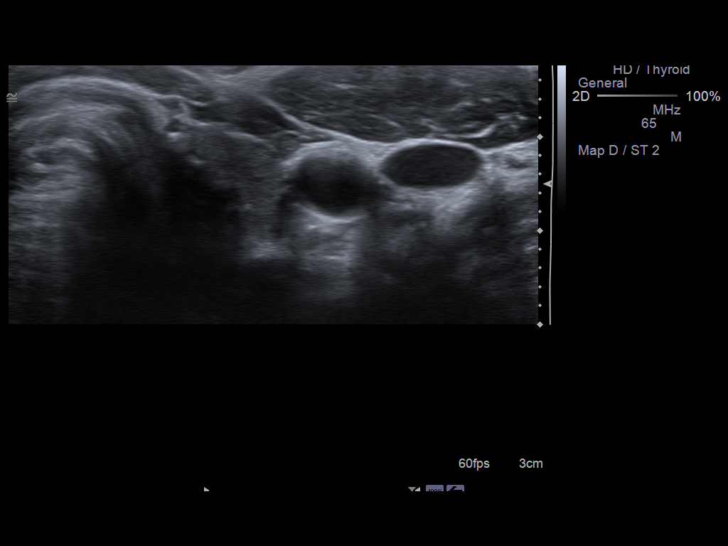
[im 28/34]
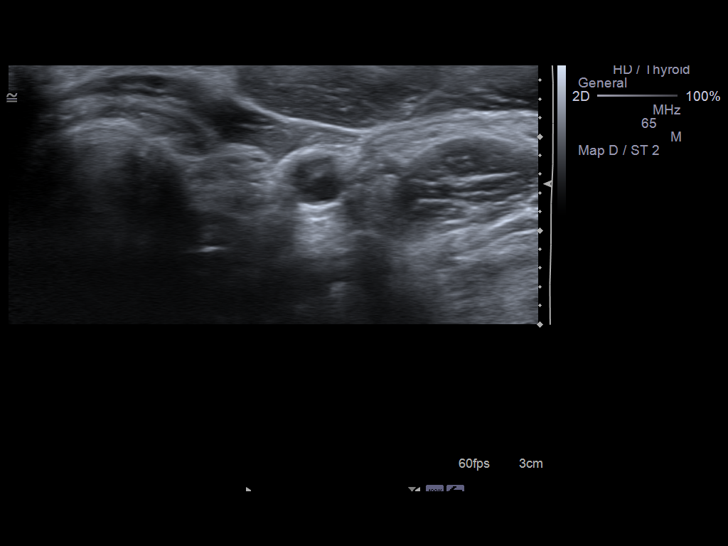
[im 31/34]
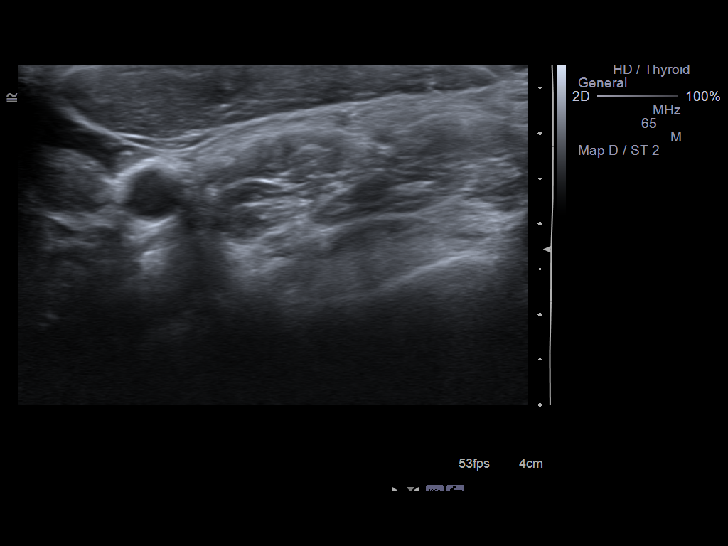
[im 34/34]
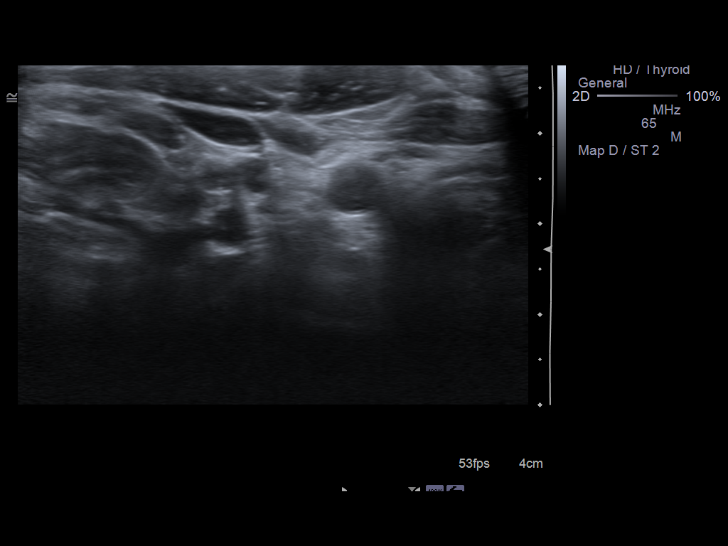

[14 of 25 positions shown; findings below may reference images not displayed]

FINDINGS: The right lobe of the thyroid gland measures 4 x 1.3 x 1.1 cm.

The left lobe measures 3.4 x 1.1 x 1.1 cm.

The isthmus measures 0.3 cm.

Thyroid echotexture:  The thyroid gland is diffusely heterogeneous.

Focal lesions:  No focal thyroid lesion identified.

Lymphadenopathy:  None.
IMPRESSION: 1. Normal sized, heterogeneous appearing thyroid gland without
evidence for mass.

## 2015-07-29 ENCOUNTER — Other Ambulatory Visit: Payer: Self-pay | Admitting: Family Medicine

## 2015-07-29 NOTE — Telephone Encounter (Signed)
Please advise refill as D. Adriana SimasCook has changed offices.  Thanks, Kenney Housemananya, RN

## 2015-07-30 NOTE — Telephone Encounter (Signed)
Rx filled. Patient will need to be seen in office for further refills.  Katina Degreealeb M. Jimmey RalphParker, MD [facility] Resident PGY-2 07/30/2015 8:48 AM

## 2015-07-30 NOTE — Telephone Encounter (Signed)
LM for patient to call back.  Please help her in scheduling an appt with pcp to follow up on her thyroid.  Thanks Limited BrandsJazmin Hartsell,CMA

## 2015-10-28 ENCOUNTER — Other Ambulatory Visit: Payer: Self-pay | Admitting: Family Medicine

## 2015-10-28 NOTE — Telephone Encounter (Signed)
Rx filled. Patient needs office appointment for further refills.  Katina Degreealeb M. Jimmey RalphParker, MD [facility] Resident PGY-2 10/28/2015 3:49 PM

## 2015-10-30 NOTE — Telephone Encounter (Signed)
LM for patient to call back.  Please assist her in making an appt to follow up with pcp for her thyroid. Karin Griffith,CMA

## 2015-11-27 ENCOUNTER — Other Ambulatory Visit: Payer: Self-pay | Admitting: Family Medicine

## 2015-11-27 MED ORDER — LEVOTHYROXINE SODIUM 112 MCG PO TABS: ORAL_TABLET | ORAL | Status: DC

## 2015-11-27 NOTE — Telephone Encounter (Signed)
Rx filled.  Katina Degreealeb M. Jimmey RalphParker, MD [facility] Resident PGY-2 11/27/2015 2:10 PM

## 2015-11-27 NOTE — Telephone Encounter (Signed)
She is about to run out of her prescription for Levothyroxine and wanted to know if it could be refilled before her appointment on the the 15th or should she make a sooner appointment.  Please call asap, thank you.

## 2015-11-28 NOTE — Telephone Encounter (Signed)
Left message informing pt of rx refill.

## 2015-12-02 ENCOUNTER — Ambulatory Visit (INDEPENDENT_AMBULATORY_CARE_PROVIDER_SITE_OTHER): Payer: PRIVATE HEALTH INSURANCE | Admitting: Family Medicine

## 2015-12-02 ENCOUNTER — Encounter: Payer: Self-pay | Admitting: Family Medicine

## 2015-12-02 VITALS — BP 119/72 | HR 75 | Temp 97.9°F | Wt 134.0 lb

## 2015-12-02 DIAGNOSIS — E039 Hypothyroidism, unspecified: Secondary | ICD-10-CM

## 2015-12-02 LAB — TSH: TSH: 0.97 mIU/L

## 2015-12-02 NOTE — Patient Instructions (Addendum)
We'll call you with the lab results.  TSH at diagnosis in 2013 was 49.600  TSH January 2016 was 14.207

## 2015-12-02 NOTE — Progress Notes (Signed)
   Subjective:    Patient ID: Chelsea JarvisKelli S Medina, female    DOB: 12/09/1980, 35 y.o.   MRN: [id number]  HPI  CC: hypothyroid follow up   # Hypothyroidism:  Ran out of synthroid 2-3 days ago  Started taking her older medication, 100mcg ROS: no changes in vision, no hair loss, no hot/cold intolerance, no palpitations, no weight loss or gain  Social Hx: former smoker (quit 2014)  Review of Systems   See HPI for ROS.   Past medical history, surgical, family, and social history reviewed and updated in the EMR as appropriate. Objective:  BP 119/72 mmHg  Pulse 75  Temp(Src) 97.9 F (36.6 C) (Oral)  Wt 134 lb (60.782 kg) Vitals and nursing note reviewed  General: no apparent distress  Neck: supple, no thyromegaly noted CV: normal rate, regular rhythm, no murmurs, rubs or gallop.  Resp: clear to auscultation bilaterally, normal effort  Assessment & Plan:  Hypothyroidism PCP had sent in refill 5 days ago for synthroid. TSH re-check today, will follow up with patient on result. Follow up 1 year if otherwise stable and no complaints.

## 2015-12-02 NOTE — Assessment & Plan Note (Signed)
PCP had sent in refill 5 days ago for synthroid. TSH re-check today, will follow up with patient on result. Follow up 1 year if otherwise stable and no complaints.

## 2015-12-03 ENCOUNTER — Telehealth: Payer: Self-pay | Admitting: Family Medicine

## 2015-12-03 NOTE — Telephone Encounter (Signed)
Called and informed of normal TSH, keep on same dose. Patient had no further questions.

## 2016-06-18 ENCOUNTER — Telehealth: Payer: Self-pay | Admitting: Family Medicine

## 2016-06-18 NOTE — Telephone Encounter (Signed)
Pt s calling and would like us to mail a copy of her Tdap and when it was done to her home address. Address is Epic is correct. jw

## 2016-06-22 NOTE — Telephone Encounter (Signed)
Unable to leave message for patient since her VM is not set up.  Please inform her if she calls back that we don't have a record of when she received her Tdap vaccine on file.  She may need to contact her last PCP's office or even the provider she saw for her pregnancies since they offer this vaccine during that time.  Tenasia Aull,CMA

## 2016-11-19 ENCOUNTER — Other Ambulatory Visit: Payer: Self-pay | Admitting: Family Medicine

## 2016-11-19 NOTE — Telephone Encounter (Signed)
Tried calling patient but there was no answer or voicemail.  Will try again tomorrow.  Jazmin Hartsell,CMA

## 2016-11-19 NOTE — Telephone Encounter (Signed)
30 day supply given. Patient needs office appointment for further refills.  Katina Degreealeb M. Jimmey RalphParker, MD [facility] Resident PGY-3 11/19/2016 3:19 PM

## 2016-11-19 NOTE — Telephone Encounter (Signed)
Pt was advised. Appointment made for 12-03-16. ep

## 2016-12-03 ENCOUNTER — Ambulatory Visit: Payer: PRIVATE HEALTH INSURANCE | Admitting: Family Medicine

## 2016-12-17 ENCOUNTER — Other Ambulatory Visit: Payer: Self-pay | Admitting: Family Medicine

## 2016-12-17 NOTE — Telephone Encounter (Signed)
10 day supply given. Patient has to have appointment for further refills.  Katina Degreealeb M. Jimmey RalphParker, MD [facility] Resident PGY-3 12/17/2016 9:32 AM

## 2016-12-24 ENCOUNTER — Encounter: Payer: Self-pay | Admitting: Family Medicine

## 2016-12-24 ENCOUNTER — Ambulatory Visit (INDEPENDENT_AMBULATORY_CARE_PROVIDER_SITE_OTHER): Payer: PRIVATE HEALTH INSURANCE | Admitting: Family Medicine

## 2016-12-24 VITALS — BP 112/76 | HR 82 | Temp 98.0°F | Ht 67.0 in | Wt 132.8 lb

## 2016-12-24 DIAGNOSIS — E039 Hypothyroidism, unspecified: Secondary | ICD-10-CM | POA: Diagnosis not present

## 2016-12-24 MED ORDER — LEVOTHYROXINE SODIUM 112 MCG PO TABS: ORAL_TABLET | ORAL | 3 refills | Status: AC

## 2016-12-24 NOTE — Progress Notes (Signed)
    Subjective:  Pilar JarvisKelli S Alverson is a 36 y.o. female [facility] with a chief complaint of hypothyroidism.   HPI:  Hypothyroidism Chronic problem. Stable. Currently on synthroid 112mcg daily. Tolerating this without obvious side effects. No hot or cold intolerance. No constipation or diarrhea. No dry or oily skin.   ROS: Per HPI  Objective:  Physical Exam: BP 112/76   Pulse 82   Temp 98 F (36.7 C) (Oral)   Ht 5\' 7"  (1.702 m)   Wt 132 lb 12.8 oz (60.2 kg)   LMP 12/11/2016 (Exact Date)   SpO2 99%   BMI 20.80 kg/m   Gen: NAD, resting comfortably HEENT: No thyromegaly CV: RRR with no murmurs appreciated Pulm: NWOB, CTAB with no crackles, wheezes, or rhonchi MSK: no edema, cyanosis, or clubbing noted Skin: warm, dry Neuro: grossly normal, moves all extremities Psych: Normal affect and thought content  Assessment/Plan:  Hypothyroidism Check TSH. Refill synthroid. Follow up 1 year.   Katina Degreealeb M. Jimmey RalphParker, MD [facility] Resident PGY-3 12/24/2016 10:19 AM

## 2016-12-24 NOTE — Patient Instructions (Signed)
I sent in your refills.  We will check blood work.  You are due for your pap.  You should have repeat levels in 1 year.  Take care,  Dr Jimmey RalphParker

## 2016-12-24 NOTE — Assessment & Plan Note (Addendum)
Stable. Check TSH. Refill synthroid. Follow up 1 year.

## 2016-12-25 LAB — TSH: TSH: 12.39 u[IU]/mL — ABNORMAL HIGH (ref 0.450–4.500)

## 2016-12-30 ENCOUNTER — Other Ambulatory Visit: Payer: Self-pay | Admitting: Family Medicine

## 2016-12-30 ENCOUNTER — Telehealth: Payer: Self-pay | Admitting: Family Medicine

## 2016-12-30 DIAGNOSIS — E039 Hypothyroidism, unspecified: Secondary | ICD-10-CM

## 2016-12-30 NOTE — Telephone Encounter (Signed)
Attempted to call patient to discuss results. Left VM with call back number. TSH low. Will need test repeated in a few weeks. This order has been put in.  Shaquinta Peruski M. Kiona Blume, MD [facility] Resident PGY-3 12/30/2016 2:09 PM   

## 2016-12-30 NOTE — Telephone Encounter (Signed)
Attempted to call patient to discuss results. Left VM with call back number. TSH low. Will need test repeated in a few weeks. This order has been put in.  Katina Degreealeb M. Jimmey RalphParker, MD [facility] Resident PGY-3 12/30/2016 2:09 PM

## 2016-12-30 NOTE — Telephone Encounter (Signed)
Tried calling patient but there was no answer or VM available. Isatu Macinnes,CMA

## 2016-12-30 NOTE — Telephone Encounter (Signed)
CORRECTION: TSH is high.

## 2016-12-31 NOTE — Telephone Encounter (Signed)
Tried calling patient again with no voicemail to leave a message on.  Will try another time before sending her a letter. Chelsea Medina,CMA

## 2017-01-01 ENCOUNTER — Encounter: Payer: Self-pay | Admitting: *Deleted

## 2017-01-01 NOTE — Telephone Encounter (Signed)
Letter mailed to patient. Jazmin Hartsell,CMA  

## 2017-02-03 ENCOUNTER — Other Ambulatory Visit: Payer: PRIVATE HEALTH INSURANCE

## 2017-02-03 DIAGNOSIS — E039 Hypothyroidism, unspecified: Secondary | ICD-10-CM

## 2017-02-12 LAB — TSH: TSH: 5.69 u[IU]/mL — ABNORMAL HIGH (ref 0.450–4.500)

## 2018-06-21 ENCOUNTER — Other Ambulatory Visit: Payer: Self-pay | Admitting: Family Medicine
